# Patient Record
Sex: Female | Born: 1990
Health system: Southern US, Community
[De-identification: ages and names within clinical notes are randomized; demographics above are authoritative.]

## PROBLEM LIST (undated history)

## (undated) DIAGNOSIS — O09299 Supervision of pregnancy with other poor reproductive or obstetric history, unspecified trimester: Secondary | ICD-10-CM

## (undated) DIAGNOSIS — N83209 Unspecified ovarian cyst, unspecified side: Secondary | ICD-10-CM

## (undated) HISTORY — DX: Supervision of pregnancy with other poor reproductive or obstetric history, unspecified trimester: O09.299

---

## 2013-01-25 ENCOUNTER — Encounter (HOSPITAL_COMMUNITY): Payer: Self-pay | Admitting: *Deleted

## 2013-01-25 ENCOUNTER — Emergency Department (HOSPITAL_COMMUNITY): Payer: Self-pay

## 2013-01-25 ENCOUNTER — Emergency Department (HOSPITAL_COMMUNITY)
Admission: EM | Admit: 2013-01-25 | Discharge: 2013-01-25 | Disposition: A | Discharge status: Home / Self Care | Payer: Self-pay | Attending: Emergency Medicine | Admitting: Emergency Medicine

## 2013-01-25 DIAGNOSIS — N83209 Unspecified ovarian cyst, unspecified side: Secondary | ICD-10-CM | POA: Insufficient documentation

## 2013-01-25 DIAGNOSIS — R112 Nausea with vomiting, unspecified: Secondary | ICD-10-CM | POA: Insufficient documentation

## 2013-01-25 DIAGNOSIS — R630 Anorexia: Secondary | ICD-10-CM | POA: Insufficient documentation

## 2013-01-25 DIAGNOSIS — F17200 Nicotine dependence, unspecified, uncomplicated: Secondary | ICD-10-CM | POA: Insufficient documentation

## 2013-01-25 DIAGNOSIS — Z3202 Encounter for pregnancy test, result negative: Secondary | ICD-10-CM | POA: Insufficient documentation

## 2013-01-25 LAB — URINALYSIS, ROUTINE W REFLEX MICROSCOPIC
Bilirubin Urine: NEGATIVE
Glucose, UA: NEGATIVE mg/dL
Hgb urine dipstick: NEGATIVE
Ketones, ur: NEGATIVE mg/dL
Leukocytes, UA: NEGATIVE
Nitrite: NEGATIVE
Protein, ur: NEGATIVE mg/dL
Specific Gravity, Urine: 1.01 (ref 1.005–1.030)
Urobilinogen, UA: 0.2 mg/dL (ref 0.0–1.0)
pH: 7 (ref 5.0–8.0)

## 2013-01-25 LAB — COMPREHENSIVE METABOLIC PANEL
ALT: 11 U/L (ref 0–35)
AST: 16 U/L (ref 0–37)
Albumin: 3.4 g/dL — ABNORMAL LOW (ref 3.5–5.2)
Alkaline Phosphatase: 62 U/L (ref 39–117)
BUN: 6 mg/dL (ref 6–23)
CO2: 21 mEq/L (ref 19–32)
Calcium: 9.1 mg/dL (ref 8.4–10.5)
Chloride: 106 mEq/L (ref 96–112)
Creatinine, Ser: 0.81 mg/dL (ref 0.50–1.10)
GFR calc Af Amer: >90 mL/min (ref >90)
GFR calc non Af Amer: >90 mL/min (ref >90)
Glucose, Bld: 88 mg/dL (ref 70–99)
Potassium: 3.6 mEq/L (ref 3.5–5.1)
Sodium: 136 mEq/L (ref 135–145)
Total Bilirubin: 0.2 mg/dL — ABNORMAL LOW (ref 0.3–1.2)
Total Protein: 6.3 g/dL (ref 6.0–8.3)

## 2013-01-25 LAB — CBC WITH DIFFERENTIAL/PLATELET
Basophils Absolute: 0 10*3/uL (ref 0.0–0.1)
Basophils Relative: 1 % (ref 0–1)
Eosinophils Absolute: 0.1 10*3/uL (ref 0.0–0.7)
Eosinophils Relative: 3 % (ref 0–5)
HCT: 30 % — ABNORMAL LOW (ref 36.0–46.0)
Hemoglobin: 8.9 g/dL — ABNORMAL LOW (ref 12.0–15.0)
Lymphocytes Relative: 30 % (ref 12–46)
Lymphs Abs: 1.3 10*3/uL (ref 0.7–4.0)
MCH: 20.8 pg — ABNORMAL LOW (ref 26.0–34.0)
MCHC: 29.7 g/dL — ABNORMAL LOW (ref 30.0–36.0)
MCV: 70.1 fL — ABNORMAL LOW (ref 78.0–100.0)
Monocytes Absolute: 0.4 10*3/uL (ref 0.1–1.0)
Monocytes Relative: 8 % (ref 3–12)
Neutro Abs: 2.5 10*3/uL (ref 1.7–7.7)
Neutrophils Relative %: 58 % (ref 43–77)
Platelets: 377 10*3/uL (ref 150–400)
RBC: 4.28 MIL/uL (ref 3.87–5.11)
RDW: 19.5 % — ABNORMAL HIGH (ref 11.5–15.5)
WBC: 4.3 10*3/uL (ref 4.0–10.5)

## 2013-01-25 MED ORDER — IBUPROFEN 800 MG PO TABS: 800.0000 mg | ORAL_TABLET | Freq: Three times a day (TID) | ORAL | Status: DC | PRN

## 2013-01-25 MED ORDER — HYDROCODONE-ACETAMINOPHEN 5-325 MG PO TABS: 1.0000 | ORAL_TABLET | Freq: Four times a day (QID) | ORAL | Status: DC | PRN

## 2013-01-25 MED ORDER — SODIUM CHLORIDE 0.9 % IV BOLUS (SEPSIS)
1000.0000 mL | Freq: Once | INTRAVENOUS | Status: AC
  Administered 2013-01-25: 1000 mL via INTRAVENOUS

## 2013-01-25 NOTE — ED Notes (Signed)
Patient states lower abdominal pain starting this am at 0300, patient also states vomiting yesterday but none today, patient states pain 8/10. Patient also states there is a possibility of her being pregnant.  LMP, 12/17/12

## 2013-01-25 NOTE — ED Notes (Signed)
Patient transported to Ultrasound 

## 2013-01-25 NOTE — ED Notes (Signed)
Peripheral IV removed; site soft, clean, dry, intact, catheter intact.

## 2013-01-25 NOTE — Progress Notes (Signed)
Addendum 01/25/13 1404 W. Aundria Rud RN BSN [facility] ED CM spoke with patient. Pt states she does not have a PCP/ Medical insurance. Discussed The importance of PCP and continuity of care. Pt verbalized understanding and agrees. Provided pt with information [facility] to call for follow up appt. Also provided a [employer]. No further CM needs identified.

## 2013-01-25 NOTE — ED Provider Notes (Signed)
CSN: [id number]     Arrival date & time 01/25/13  1031 History     First MD Initiated Contact with Patient 01/25/13 1055     Chief Complaint  Patient presents with  . Abdominal Pain   (Consider location/radiation/quality/duration/timing/severity/associated sxs/prior Treatment) HPI Comments: Pt presents to the ED with sudden onset lower abdominal pain which woke her from sleep around 3:00 AM. The pain is sharp and constant, severity 8/10, no radiation. Pt states she has never had pain like this before. Also reports nausea, vomited last night before bed. She has not had anything to eat or drink today. She has not taken any meds at home to help with symptoms. Denies fever, diarrhea, constipation, dysuria, or hematuria. LMP 12/17/12, pt denies vaginal discharge or bleeding.   Patient is a 22 y.o. female presenting with abdominal pain.  Abdominal Pain Associated symptoms: nausea and vomiting   Associated symptoms: no chills, no constipation, no cough, no diarrhea, no dysuria, no fever, no hematuria, no shortness of breath, no vaginal bleeding and no vaginal discharge     History reviewed. No pertinent past medical history. History reviewed. No pertinent past surgical history. No family history on file. History  Substance Use Topics  . Smoking status: Current Every Day Smoker    Types: Cigarettes  . Smokeless tobacco: Not on file  . Alcohol Use: No   OB History   Grav Para Term Preterm Abortions TAB SAB Ect Mult Living                 Review of Systems  Constitutional: Positive for appetite change. Negative for fever and chills.  Respiratory: Negative for cough and shortness of breath.   Gastrointestinal: Positive for nausea, vomiting and abdominal pain. Negative for diarrhea and constipation.  Genitourinary: Negative for dysuria, hematuria, flank pain, vaginal bleeding, vaginal discharge, difficulty urinating, vaginal pain and pelvic pain.    Allergies  Review of patient's  allergies indicates no known allergies.  Home Medications  No current outpatient prescriptions on file. BP 132/90  Pulse 96  Temp(Src) 98.7 F (37.1 C) (Oral)  Resp 18  Ht 5\' 9"  (1.753 m)  Wt 135 lb (61.236 kg)  BMI 19.93 kg/m2  SpO2 100%  LMP 12/17/2012 Physical Exam  Nursing note and vitals reviewed. Constitutional: She is oriented to person, place, and time. She appears well-developed and well-nourished. No distress.  HENT:  Head: Normocephalic and atraumatic.  Mouth/Throat: Oropharynx is clear and moist.  Eyes: Pupils are equal, round, and reactive to light.  Neck: Normal range of motion. Neck supple.  Cardiovascular: Normal rate, regular rhythm and normal heart sounds.  Exam reveals no gallop and no friction rub.   No murmur heard. Pulmonary/Chest: Effort normal and breath sounds normal. No respiratory distress.  Abdominal: Soft. Bowel sounds are normal. She exhibits no distension. There is tenderness in the right lower quadrant, periumbilical area, suprapubic area and left lower quadrant. There is no rebound and no guarding.  Genitourinary: There is no tenderness on the right labia. There is no tenderness on the left labia. Cervix exhibits no motion tenderness and no discharge. Right adnexum displays tenderness. Left adnexum displays tenderness. There is tenderness around the vagina. No vaginal discharge found.  Neurological: She is alert and oriented to person, place, and time.  Skin: Skin is warm and dry. No rash noted. No erythema.    ED Course   Procedures (including critical care time)  Patient, he referred to the [facility] hospital GYN clinic for followup  and reevaluation.  She is told to return here as needed.  The patient has been stable here in the emergency department.  Patient's pain is under better control at this time  MDM    Carlyle Dolly, PA-C 01/27/13 0050

## 2013-01-25 NOTE — ED Notes (Signed)
Patient returned from Ultrasound. 

## 2013-01-27 NOTE — ED Provider Notes (Signed)
Medical screening examination/treatment/procedure(s) were performed by non-physician practitioner and as supervising physician I was immediately available for consultation/collaboration.  Mahdi Frye L Jayd Cadieux, MD 01/27/13 1424 

## 2013-02-11 ENCOUNTER — Encounter: Payer: Self-pay | Admitting: Obstetrics & Gynecology

## 2013-02-11 ENCOUNTER — Ambulatory Visit (INDEPENDENT_AMBULATORY_CARE_PROVIDER_SITE_OTHER): Payer: Self-pay | Admitting: Obstetrics & Gynecology

## 2013-02-11 VITALS — BP 103/68 | HR 89 | Temp 98.7°F | Ht 69.0 in | Wt 131.7 lb

## 2013-02-11 DIAGNOSIS — N949 Unspecified condition associated with female genital organs and menstrual cycle: Secondary | ICD-10-CM

## 2013-02-11 DIAGNOSIS — G8929 Other chronic pain: Secondary | ICD-10-CM | POA: Insufficient documentation

## 2013-02-11 DIAGNOSIS — N83209 Unspecified ovarian cyst, unspecified side: Secondary | ICD-10-CM

## 2013-02-11 MED ORDER — IBUPROFEN 800 MG PO TABS: 800.0000 mg | ORAL_TABLET | Freq: Three times a day (TID) | ORAL | Status: DC | PRN

## 2013-02-11 NOTE — Patient Instructions (Signed)
Ovarian Cyst The ovaries are small organs that are on each side of the uterus. The ovaries are the organs that produce the female hormones, estrogen and progesterone. An ovarian cyst is a sac filled with fluid that can vary in its size. It is normal for a small cyst to form in women who are in the childbearing age and who have menstrual periods. This type of cyst is called a follicle cyst that becomes an ovulation cyst (corpus luteum cyst) after it produces the women's egg. It later goes away on its own if the woman does not become pregnant. There are other kinds of ovarian cysts that may cause problems and may need to be treated. The most serious problem is a cyst with cancer. It should be noted that menopausal women who have an ovarian cyst are at a higher risk of it being a cancer cyst. They should be evaluated very quickly, thoroughly and followed closely. This is especially true in menopausal women because of the high rate of ovarian cancer in women in menopause. CAUSES AND TYPES OF OVARIAN CYSTS:  FUNCTIONAL CYST: The follicle/corpus luteum cyst is a functional cyst that occurs every month during ovulation with the menstrual cycle. They go away with the next menstrual cycle if the woman does not get pregnant. Usually, there are no symptoms with a functional cyst.  ENDOMETRIOMA CYST: This cyst develops from the lining of the uterus tissue. This cyst gets in or on the ovary. It grows every month from the bleeding during the menstrual period. It is also called a "chocolate cyst" because it becomes filled with blood that turns brown. This cyst can cause pain in the lower abdomen during intercourse and with your menstrual period.  CYSTADENOMA CYST: This cyst develops from the cells on the outside of the ovary. They usually are not cancerous. They can get very big and cause lower abdomen pain and pain with intercourse. This type of cyst can twist on itself, cut off its blood supply and cause severe pain. It  also can easily rupture and cause a lot of pain.  DERMOID CYST: This type of cyst is sometimes found in both ovaries. They are found to have different kinds of body tissue in the cyst. The tissue includes skin, teeth, hair, and/or cartilage. They usually do not have symptoms unless they get very big. Dermoid cysts are rarely cancerous.  POLYCYSTIC OVARY: This is a rare condition with hormone problems that produces many small cysts on both ovaries. The cysts are follicle-like cysts that never produce an egg and become a corpus luteum. It can cause an increase in body weight, infertility, acne, increase in body and facial hair and lack of menstrual periods or rare menstrual periods. Many women with this problem develop type 2 diabetes. The exact cause of this problem is unknown. A polycystic ovary is rarely cancerous.  THECA LUTEIN CYST: Occurs when too much hormone (human chorionic gonadotropin) is produced and over-stimulates the ovaries to produce an egg. They are frequently seen when doctors stimulate the ovaries for invitro-fertilization (test tube babies).  LUTEOMA CYST: This cyst is seen during pregnancy. Rarely it can cause an obstruction to the birth canal during labor and delivery. They usually go away after delivery. SYMPTOMS   Pelvic pain or pressure.  Pain during sexual intercourse.  Increasing girth (swelling) of the abdomen.  Abnormal menstrual periods.  Increasing pain with menstrual periods.  You stop having menstrual periods and you are not pregnant. DIAGNOSIS  The diagnosis can   be made during:  Routine or annual pelvic examination (common).  Ultrasound.  X-ray of the pelvis.  CT Scan.  MRI.  Blood tests. TREATMENT   Treatment may only be to follow the cyst monthly for 2 to 3 months with your caregiver. Many go away on their own, especially functional cysts.  May be aspirated (drained) with a long needle with ultrasound, or by laparoscopy (inserting a tube into  the pelvis through a small incision).  The whole cyst can be removed by laparoscopy.  Sometimes the cyst may need to be removed through an incision in the lower abdomen.  Hormone treatment is sometimes used to help dissolve certain cysts.  Birth control pills are sometimes used to help dissolve certain cysts. HOME CARE INSTRUCTIONS  Follow your caregiver's advice regarding:  Medicine.  Follow up visits to evaluate and treat the cyst.  You may need to come back or make an appointment with another caregiver, to find the exact cause of your cyst, if your caregiver is not a gynecologist.  Get your yearly and recommended pelvic examinations and Pap tests.  Let your caregiver know if you have had an ovarian cyst in the past. SEEK MEDICAL CARE IF:   Your periods are late, irregular, they stop, or are painful.  Your stomach (abdomen) or pelvic pain does not go away.  Your stomach becomes larger or swollen.  You have pressure on your bladder or trouble emptying your bladder completely.  You have painful sexual intercourse.  You have feelings of fullness, pressure, or discomfort in your stomach.  You lose weight for no apparent reason.  You feel generally ill.  You become constipated.  You lose your appetite.  You develop acne.  You have an increase in body and facial hair.  You are gaining weight, without changing your exercise and eating habits.  You think you are pregnant. SEEK IMMEDIATE MEDICAL CARE IF:   You have increasing abdominal pain.  You feel sick to your stomach (nausea) and/or vomit.  You develop a fever that comes on suddenly.  You develop abdominal pain during a bowel movement.  Your menstrual periods become heavier than usual. Document Released: 06/04/2005 Document Revised: 08/27/2011 Document Reviewed: 04/07/2009 [facility] Patient Information 2014 [place], [place].  Endometriosis Endometriosis is a disease that occurs when the endometrium (lining  of the uterus) is misplaced outside of its normal location. It may occur in many locations close to the uterus (womb), but commonly on the ovaries, fallopian tubes, vagina (birth canal) and bowel located close to the uterus. Because the uterus sloughs (expels) its lining every month (menses), there is bleeding whereever the endometrial tissue is located. SYMPTOMS  Often there are no symptoms. However, because blood is irritating to tissues not normally exposed to it, when symptoms occur they vary with the location of the misplaced endometrium. Symptoms often include back and abdominal pain. Periods may be heavier and intercourse may be painful. Infertility may be present. You may have all of these symptoms at one time or another or you may have months with no symptoms at all. Although the symptoms occur mainly during menses, they can occur mid-cycle as well, and usually terminate with menopause. DIAGNOSIS  Your caregiver may recommend a blood test and urine test (urinalysis) to help rule out other conditions. Another common test is ultrasound, a painless procedure that uses sound waves to make a sonogram "picture" of abnormal tissue that could be endometriosis. If your bowel movements are painful around your periods, your caregiver  may advise a barium enema (an X-ray of the lower bowel), to try to find the source of your pain. This is sometimes confirmed by laparoscopy. Laparoscopy is a procedure where your caregiver looks into your abdomen with a laparoscope (a small pencil sized telescope). Your caregiver may take a tiny piece of tissue (biopsy) from any abnormal tissue to confirm or document your problem. These tissues are sent to the lab and a pathologist looks at them under the microscope to give a microscopic diagnosis. TREATMENT  Once the diagnosis is made, it can be treated by destruction of the misplaced endometrial tissue using heat (diathermy), laser, cutting (excision), or chemical means. It may  also be treated with hormonal therapy. When using hormonal therapy menses are eliminated, therefore eliminating the monthly exposure to blood by the misplaced endometrial tissue. Only in severe cases is it necessary to perform a hysterectomy with removal of the tubes, uterus and ovaries. HOME CARE INSTRUCTIONS   Only take over-the-counter or prescription medicines for pain, discomfort, or fever as directed by your caregiver.  Avoid activities that produce pain, including a physical sexual relationship.  Do not take aspirin as this may increase bleeding when not on hormonal therapy.  See your caregiver for pain or problems not controlled with treatment. SEEK IMMEDIATE MEDICAL CARE IF:   Your pain is severe and is not responding to pain medicine.  You develop severe nausea and vomiting, or you cannot keep foods down.  Your pain localizes to the right lower part of your abdomen (possible appendicitis).  You have swelling or increasing pain in the abdomen.  You have a fever.  You see blood in your stool. MAKE SURE YOU:   Understand these instructions.  Will watch your condition.  Will get help right away if you are not doing well or get worse. Document Released: 06/01/2000 Document Revised: 08/27/2011 Document Reviewed: 01/21/2008 [facility] Patient Information 2014 [place], [place].  Diagnostic Laparoscopy Laparoscopy is a surgical procedure. It is used to diagnose and treat diseases inside the belly(abdomen). It is usually a brief, common, and relatively simple procedure. The laparoscopeis a thin, lighted, pencil-sized instrument. It is like a telescope. It is inserted into your abdomen through a small cut (incision). Your caregiver can look at the organs inside your body through this instrument. He or she can see if there is anything abnormal. Laparoscopy can be done either in a hospital or outpatient clinic. You may be given a mild sedative to help you relax before the procedure. Once  in the operating room, you will be given a drug to make you sleep (general anesthesia). Laparoscopy usually lasts less than 1 hour. After the procedure, you will be monitored in a recovery area until you are stable and doing well. Once you are home, it will take 2 to 3 days to fully recover. RISKS AND COMPLICATIONS  Laparoscopy has relatively few risks. Your caregiver will discuss the risks with you before the procedure. Some problems that can occur include:  Infection.  Bleeding.  Damage to other organs.  Anesthetic side effects. PROCEDURE Once you receive anesthesia, your surgeon inflates the abdomen with a harmless gas (carbon dioxide). This makes the organs easier to see. The laparoscope is inserted into the abdomen through a small incision. This allows your surgeon to see into the abdomen. Other small instruments are also inserted into the abdomen through other small openings. Many surgeons attach a video camera to the laparoscope to enlarge the view. During a diagnostic laparoscopy, the surgeon  may be looking for inflammation, infection, or cancer. Your surgeon may take tissue samples(biopsies). The samples are sent to a specialist in looking at cells and tissue samples (pathologist). The pathologist examines them under a microscope. Biopsies can help to diagnose or confirm a disease. AFTER THE PROCEDURE   The gas is released from inside the abdomen.  The incisions are closed with stitches (sutures). Because these incisions are small (usually less than 1/2 inch), there is usually minimal discomfort after the procedure. There may be some mild discomfort in the throat. This is from the tube placed in the throat while you were sleeping. You may have some mild abdominal discomfort. There may also be discomfort from the instrument placement incisions in the abdomen.  The recovery time is shortened as long as there are no complications.  You will rest in a recovery room until stable and doing  well. As long as there are no complications, you may be allowed to go home. FINDING OUT THE RESULTS OF YOUR TEST Not all test results are available during your visit. If your test results are not back during the visit, make an appointment with your caregiver to find out the results. Do not assume everything is normal if you have not heard from your caregiver or the medical facility. It is important for you to follow up on all of your test results. HOME CARE INSTRUCTIONS   Take all medicines as directed.  Only take over-the-counter or prescription medicines for pain, discomfort, or fever as directed by your caregiver.  Resume daily activities as directed.  Showers are preferred over baths.  You may resume sexual activities in 1 week or as directed.  Do not drive while taking narcotics. SEEK MEDICAL CARE IF:   There is increasing abdominal pain.  There is new pain in the shoulders (shoulder strap areas).  You feel lightheaded or faint.  You have the chills.  You or your child has an oral temperature above 102 F (38.9 C).  There is pus-like (purulent) drainage from any of the wounds.  You are unable to pass gas or have a bowel movement.  You feel sick to your stomach (nauseous) or throw up (vomit). MAKE SURE YOU:   Understand these instructions.  Will watch your condition.  Will get help right away if you are not doing well or get worse. Document Released: 09/10/2000 Document Revised: 08/27/2011 Document Reviewed: 06/04/2007 [facility] Patient Information 2014 [place], [place].

## 2013-02-12 NOTE — Progress Notes (Signed)
GYNECOLOGY CLINIC PROGRESS NOTE  History:  22 y.o. [name] here today for follow up after evaluation of pelvic pain in the ED on 01/25/13 showed a 5.4 cm complex right ovarian cyst that was thought to be a hemorrhagic cyst.  She reports that she still has constant pain all around her lower abdomen.  She says the pain has been going on for several years, since her cesarean section in 2009.  No relation to menstrual periods; no exacerbating factors but it ameliorated on pain medications.  No other associated symptoms; she reports normal menstrual periods. Normal discharge and she denies any history of STIs.  The following portions of the patient's history were reviewed and updated as appropriate: allergies, current medications, past family history, past medical history, past social history, past surgical history and problem list.  Review of Systems:  A comprehensive review of systems was negative.  Objective:  Physical Exam BP 103/68  Pulse 89  Temp(Src) 98.7 F (37.1 C) (Oral)  Ht 5\' 9"  (1.753 m)  Wt 131 lb 11.2 oz (59.739 kg)  BMI 19.44 kg/m2  LMP 01/17/2013 Gen: NAD Abd: Soft, mild diffuse tenderness on palpation; no focal points, no rebound/guarding, nondistended. Patient has excoriated eczematoid reaction below her umbilicus and on top of her well-healed cesarean scar; she reports she got this after wearing a belt buckle and she has been scratching it since then. Pelvic: Normal appearing external genitalia; normal appearing vaginal mucosa and cervix.  Normal discharge seen.  Small uterus, no other palpable masses, diffuse uterine and adnexal tenderness during bimanual exam, no CMT.   Labs and Imaging 01/25/2013  TRANSABDOMINAL ULTRASOUND OF PELVIS DOPPLER ULTRASOUND OF OVARIES Clinical Data:  Pelvic pain  Technique:  Transabdominal ultrasound examination of the pelvis was performed including evaluation of the uterus, ovaries, adnexal regions, and pelvic cul-de-sac.  Color and duplex Doppler  ultrasound was utilized to evaluate blood flow to the ovaries.  Comparison:  None.  Findings:  Uterus:  9.6 x 6.0 x 7.4 centimeters.  No myometrial masses.  Endometrium:  Endometrium is 7 mm in overall thickness.  Complex fluid is present within the endometrial canal.  No endometrial polyps or masses.  Right ovary:  The overall size of the right ovary is 6.5 x 4.8 x 6.0 cm.  There is a complex cystic lesion within the ovary measuring 5.4 x 4.2 x 5.0 cm.  It is characterized by a reticular pattern of thin septations and anechoic fluid.  Arterial and venous flow was documented on Doppler analysis within the thin rim of normal ovarian tissue surrounding the lesion.  Left ovary:  Within normal limits.  Arterial and venous Doppler flow is documented within the left ovary.  Pulsed Doppler evaluation demonstrates normal low-resistance arterial and venous waveforms in both ovaries.  IMPRESSION: No evidence of ovarian torsion.  5.4 cm complex cystic lesion within the right ovary.  This is likely a hemorrhagic cyst.  Follow-up ultrasound in 5 weeks is recommended to ensure resolution. This recommendation follows the consensus statement:  Management of Asymptomatic Ovarian and Other Adnexal Cysts Imaged at [place]:  Society of Radiologists in Ultrasound Consensus Conference Statement.  Radiology 2010; [phone].   Original Report Authenticated By: Jolaine Click, M.D.    Assessment & Plan:  Patient has chronic pelvic pain; not likely due to her ovarian cyst. The concern is about possible adhesive disease, endometriosis or other anomaly.  Recommended diagnostic laparoscopy for evaluation, patient agrees with this recommendation. Surgical risks discussed.  All questions were answered.  She was told that she will be contacted by our surgical scheduler regarding the time and date of her surgery.  She was told she may be called for a preoperative appointment about a week prior to surgery and will be given further preoperative  instructions at that visit. Printed patient education handouts about the procedure were given to the patient to review at home.  In the meantime, she was prescribed some Ibuprofen 800 mg po tid, pain precautions reviewed.  She was advised to use OTC hydrocortisone cream for her contact dermatitis and to see a dermatologist if it gets worse.

## 2013-03-26 ENCOUNTER — Ambulatory Visit: Payer: Self-pay | Admitting: Obstetrics & Gynecology

## 2013-04-07 ENCOUNTER — Ambulatory Visit (HOSPITAL_COMMUNITY): Admission: RE | Admit: 2013-04-07 | Payer: Self-pay | Source: Ambulatory Visit | Admitting: Obstetrics & Gynecology

## 2013-04-07 ENCOUNTER — Encounter (HOSPITAL_COMMUNITY): Admission: RE | Payer: Self-pay | Source: Ambulatory Visit

## 2013-04-07 SURGERY — LAPAROSCOPY, DIAGNOSTIC
Anesthesia: Choice | Site: Abdomen

## 2013-04-07 NOTE — H&P (Signed)
Patient did not show up for scheduled surgery today.  Tereso Newcomer, MD 04/07/2013 12:09 PM

## 2013-05-07 ENCOUNTER — Ambulatory Visit: Payer: Self-pay | Admitting: Obstetrics & Gynecology

## 2013-05-31 ENCOUNTER — Emergency Department (HOSPITAL_COMMUNITY): Payer: Self-pay

## 2013-05-31 ENCOUNTER — Encounter (HOSPITAL_COMMUNITY): Payer: Self-pay | Admitting: Emergency Medicine

## 2013-05-31 ENCOUNTER — Emergency Department (HOSPITAL_COMMUNITY)
Admission: EM | Admit: 2013-05-31 | Discharge: 2013-05-31 | Disposition: A | Discharge status: Home / Self Care | Payer: Self-pay | Attending: Emergency Medicine | Admitting: Emergency Medicine

## 2013-05-31 DIAGNOSIS — F17200 Nicotine dependence, unspecified, uncomplicated: Secondary | ICD-10-CM | POA: Insufficient documentation

## 2013-05-31 DIAGNOSIS — J069 Acute upper respiratory infection, unspecified: Secondary | ICD-10-CM | POA: Insufficient documentation

## 2013-05-31 MED ORDER — BENZONATATE 100 MG PO CAPS: 100.0000 mg | ORAL_CAPSULE | Freq: Three times a day (TID) | ORAL | Status: DC

## 2013-05-31 MED ORDER — BENZONATATE 100 MG PO CAPS
100.0000 mg | ORAL_CAPSULE | Freq: Once | ORAL | Status: AC
Start: 2013-05-31 — End: 2013-05-31
  Administered 2013-05-31: 100 mg via ORAL
  Filled 2013-05-31: qty 1

## 2013-05-31 NOTE — ED Notes (Signed)
Increased chest discomfort with cough and deep breathing.  Non productive cough

## 2013-05-31 NOTE — ED Notes (Signed)
The pt has had mid-chest pain since this am with sob.  No sob now.  No hx of any medical problems.  lmp last month

## 2013-05-31 NOTE — ED Provider Notes (Signed)
CSN: [id number]     Arrival date & time 05/31/13  1830 History   First MD Initiated Contact with Patient 05/31/13 2032     Chief Complaint  Patient presents with  . Chest Pain   (Consider location/radiation/quality/duration/timing/severity/associated sxs/prior Treatment) HPI Shanin Szymanowski is a 22 y.o. female who presents to the emergency department for cough, congestion, and runny nose.  Started yesterday.  Now coughed to the point that it hurts when she coughs.  Pain moderate in severity.  Worse with coughing.  Better with nothing.  Located in central chest.  No radiation.  No other symptoms.  History reviewed. No pertinent past medical history. Past Surgical History  Procedure Laterality Date  . Cesarean section  07/07/2007   Family History: Reviewed.  None pertinent.    History  Substance Use Topics  . Smoking status: Current Every Day Smoker    Types: Cigarettes  . Smokeless tobacco: Not on file  . Alcohol Use: No   OB History   Grav Para Term Preterm Abortions TAB SAB Ect Mult Living   1 1 1       1      Review of Systems  Constitutional: Negative for fever and chills.  HENT: Positive for congestion and rhinorrhea.   Respiratory: Positive for cough. Negative for shortness of breath.   Cardiovascular: Positive for chest pain.  Gastrointestinal: Negative for nausea, vomiting, abdominal pain, diarrhea and abdominal distention.  Endocrine: Negative for polyuria.  Genitourinary: Negative for dysuria.  Musculoskeletal: Negative for neck pain and neck stiffness.  Skin: Negative for rash.  Neurological: Negative for headaches.  Psychiatric/Behavioral: Negative.     Allergies  Review of patient's allergies indicates no known allergies.  Home Medications  No current outpatient prescriptions on file. BP 122/79  Pulse 68  Temp(Src) 99.2 F (37.3 C) (Oral)  Resp 20  Wt 140 lb 6.4 oz (63.685 kg)  SpO2 100%  LMP 05/01/2013 Physical Exam  Nursing note and vitals  reviewed. Constitutional: She is oriented to person, place, and time. She appears well-developed and well-nourished. No distress.  HENT:  Head: Normocephalic and atraumatic.  Right Ear: External ear normal.  Left Ear: External ear normal.  Nose: Nose normal.  Mouth/Throat: Oropharynx is clear and moist. No oropharyngeal exudate.  Eyes: EOM are normal. Pupils are equal, round, and reactive to light.  Neck: Normal range of motion. Neck supple. No tracheal deviation present.  Cardiovascular: Normal rate.   Pulmonary/Chest: Effort normal and breath sounds normal. No stridor. No respiratory distress. She has no wheezes. She has no rales. She exhibits tenderness.  Abdominal: Soft. She exhibits no distension. There is no tenderness. There is no rebound.  Musculoskeletal: Normal range of motion.  Neurological: She is alert and oriented to person, place, and time.  Skin: Skin is warm and dry. She is not diaphoretic.    ED Course  Procedures (including critical care time) Labs Review Labs Reviewed - No data to display Imaging Review Dg Chest 2 View  05/31/2013   CLINICAL DATA:  Chest pain.  Cough.  EXAM: CHEST  2 VIEW  COMPARISON:  None.  FINDINGS: The heart size and mediastinal contours are within normal limits. Both lungs are clear. The visualized skeletal structures are unremarkable.  IMPRESSION: No active cardiopulmonary disease.   Electronically Signed   By: Andreas Newport M.D.   On: 05/31/2013 20:29    EKG Interpretation   None       MDM   1. Upper respiratory infection  Lonette Stevison is a 22 y.o. female who presents to the ED with URI symptoms and pleuritic chest pain with cough.  History and exam very c/w viral URI.  Patient is WELLS/PERC negative.  No indication for CT or further testing at this point.  Doubt MI.  Atypical presentation for dissection in healthy young person.  Patient safe for discharge home.  Patient discharged.    Arloa Koh, MD 06/01/13 [phone]

## 2013-06-01 NOTE — ED Provider Notes (Signed)
I saw and evaluated the patient, reviewed the resident's note and I agree with the findings and plan.  EKG Interpretation   None        Patient here with URI symptoms. Exam benign. Vitals stable. Instructed to f/u with PCP and continue supportive care.  Dagmar Hait, MD 06/01/13 [phone]

## 2014-01-23 ENCOUNTER — Emergency Department (HOSPITAL_COMMUNITY)
Admission: EM | Admit: 2014-01-23 | Discharge: 2014-01-25 | Discharge status: Against Medical Advice | Payer: Self-pay | Attending: Emergency Medicine | Admitting: Emergency Medicine

## 2014-01-23 ENCOUNTER — Encounter (HOSPITAL_COMMUNITY): Payer: Self-pay | Admitting: Emergency Medicine

## 2014-01-23 DIAGNOSIS — Z3202 Encounter for pregnancy test, result negative: Secondary | ICD-10-CM | POA: Insufficient documentation

## 2014-01-23 DIAGNOSIS — Z8742 Personal history of other diseases of the female genital tract: Secondary | ICD-10-CM | POA: Insufficient documentation

## 2014-01-23 DIAGNOSIS — Z9889 Other specified postprocedural states: Secondary | ICD-10-CM | POA: Insufficient documentation

## 2014-01-23 DIAGNOSIS — F17200 Nicotine dependence, unspecified, uncomplicated: Secondary | ICD-10-CM | POA: Insufficient documentation

## 2014-01-23 DIAGNOSIS — R109 Unspecified abdominal pain: Secondary | ICD-10-CM | POA: Insufficient documentation

## 2014-01-23 HISTORY — DX: Unspecified ovarian cyst, unspecified side: N83.209

## 2014-01-23 LAB — CBC WITH DIFFERENTIAL/PLATELET
Basophils Absolute: 0 10*3/uL (ref 0.0–0.1)
Basophils Relative: 1 % (ref 0–1)
EOS ABS: 0.1 10*3/uL (ref 0.0–0.7)
Eosinophils Relative: 2 % (ref 0–5)
HEMATOCRIT: 30.4 % — AB (ref 36.0–46.0)
HEMOGLOBIN: 8.9 g/dL — AB (ref 12.0–15.0)
LYMPHS ABS: 2.4 10*3/uL (ref 0.7–4.0)
Lymphocytes Relative: 49 % — ABNORMAL HIGH (ref 12–46)
MCH: 20.8 pg — ABNORMAL LOW (ref 26.0–34.0)
MCHC: 29.3 g/dL — AB (ref 30.0–36.0)
MCV: 71.2 fL — AB (ref 78.0–100.0)
MONO ABS: 0.4 10*3/uL (ref 0.1–1.0)
Monocytes Relative: 9 % (ref 3–12)
NEUTROS ABS: 1.8 10*3/uL (ref 1.7–7.7)
Neutrophils Relative %: 39 % — ABNORMAL LOW (ref 43–77)
Platelets: 423 10*3/uL — ABNORMAL HIGH (ref 150–400)
RBC: 4.27 MIL/uL (ref 3.87–5.11)
RDW: 19.4 % — ABNORMAL HIGH (ref 11.5–15.5)
WBC: 4.7 10*3/uL (ref 4.0–10.5)

## 2014-01-23 LAB — COMPREHENSIVE METABOLIC PANEL
ALT: 9 U/L (ref 0–35)
ANION GAP: 15 (ref 5–15)
AST: 17 U/L (ref 0–37)
Albumin: 3.9 g/dL (ref 3.5–5.2)
Alkaline Phosphatase: 68 U/L (ref 39–117)
BUN: 15 mg/dL (ref 6–23)
CO2: 21 mEq/L (ref 19–32)
Calcium: 9.2 mg/dL (ref 8.4–10.5)
Chloride: 104 mEq/L (ref 96–112)
Creatinine, Ser: 0.9 mg/dL (ref 0.50–1.10)
GFR calc non Af Amer: 89 mL/min — ABNORMAL LOW (ref >90)
GLUCOSE: 98 mg/dL (ref 70–99)
Potassium: 4.1 mEq/L (ref 3.7–5.3)
Sodium: 140 mEq/L (ref 137–147)
TOTAL PROTEIN: 7 g/dL (ref 6.0–8.3)
Total Bilirubin: <0.2 mg/dL — ABNORMAL LOW (ref 0.3–1.2)

## 2014-01-23 LAB — URINE MICROSCOPIC-ADD ON

## 2014-01-23 LAB — URINALYSIS, ROUTINE W REFLEX MICROSCOPIC
BILIRUBIN URINE: NEGATIVE
Glucose, UA: NEGATIVE mg/dL
Hgb urine dipstick: NEGATIVE
Ketones, ur: NEGATIVE mg/dL
NITRITE: NEGATIVE
Protein, ur: NEGATIVE mg/dL
Specific Gravity, Urine: 1.021 (ref 1.005–1.030)
UROBILINOGEN UA: 1 mg/dL (ref 0.0–1.0)
pH: 7 (ref 5.0–8.0)

## 2014-01-23 LAB — PREGNANCY, URINE: Preg Test, Ur: NEGATIVE

## 2014-01-23 NOTE — ED Notes (Signed)
Pt. reports low abdominal pain onset this evening , denies injury, no nausea or vomitting , denies fever or chills , no diarrhea or vaginal discharge / dysuria .

## 2014-02-01 ENCOUNTER — Emergency Department (HOSPITAL_COMMUNITY)
Admission: EM | Admit: 2014-02-01 | Discharge: 2014-02-01 | Disposition: A | Discharge status: Home / Self Care | Payer: Self-pay | Attending: Emergency Medicine | Admitting: Emergency Medicine

## 2014-02-01 ENCOUNTER — Emergency Department (HOSPITAL_COMMUNITY): Payer: Self-pay

## 2014-02-01 ENCOUNTER — Encounter (HOSPITAL_COMMUNITY): Payer: Self-pay | Admitting: Emergency Medicine

## 2014-02-01 DIAGNOSIS — R109 Unspecified abdominal pain: Secondary | ICD-10-CM | POA: Insufficient documentation

## 2014-02-01 DIAGNOSIS — Z3202 Encounter for pregnancy test, result negative: Secondary | ICD-10-CM | POA: Insufficient documentation

## 2014-02-01 DIAGNOSIS — N949 Unspecified condition associated with female genital organs and menstrual cycle: Secondary | ICD-10-CM | POA: Insufficient documentation

## 2014-02-01 DIAGNOSIS — F17200 Nicotine dependence, unspecified, uncomplicated: Secondary | ICD-10-CM | POA: Insufficient documentation

## 2014-02-01 DIAGNOSIS — G8929 Other chronic pain: Secondary | ICD-10-CM | POA: Insufficient documentation

## 2014-02-01 DIAGNOSIS — R102 Pelvic and perineal pain: Secondary | ICD-10-CM

## 2014-02-01 DIAGNOSIS — A599 Trichomoniasis, unspecified: Secondary | ICD-10-CM | POA: Insufficient documentation

## 2014-02-01 LAB — COMPREHENSIVE METABOLIC PANEL
ALBUMIN: 3.9 g/dL (ref 3.5–5.2)
ALT: 11 U/L (ref 0–35)
AST: 21 U/L (ref 0–37)
Alkaline Phosphatase: 64 U/L (ref 39–117)
Anion gap: 13 (ref 5–15)
BUN: 9 mg/dL (ref 6–23)
CO2: 20 meq/L (ref 19–32)
CREATININE: 0.73 mg/dL (ref 0.50–1.10)
Calcium: 9.4 mg/dL (ref 8.4–10.5)
Chloride: 105 mEq/L (ref 96–112)
GFR calc Af Amer: >90 mL/min (ref >90)
Glucose, Bld: 93 mg/dL (ref 70–99)
Potassium: 4.2 mEq/L (ref 3.7–5.3)
Sodium: 138 mEq/L (ref 137–147)
Total Bilirubin: <0.2 mg/dL — ABNORMAL LOW (ref 0.3–1.2)
Total Protein: 7.3 g/dL (ref 6.0–8.3)

## 2014-02-01 LAB — CBC WITH DIFFERENTIAL/PLATELET
BASOS PCT: 1 % (ref 0–1)
Basophils Absolute: 0 10*3/uL (ref 0.0–0.1)
EOS PCT: 3 % (ref 0–5)
Eosinophils Absolute: 0.1 10*3/uL (ref 0.0–0.7)
HEMATOCRIT: 31.4 % — AB (ref 36.0–46.0)
HEMOGLOBIN: 9.1 g/dL — AB (ref 12.0–15.0)
LYMPHS PCT: 35 % (ref 12–46)
Lymphs Abs: 1.3 10*3/uL (ref 0.7–4.0)
MCH: 20.2 pg — ABNORMAL LOW (ref 26.0–34.0)
MCHC: 29 g/dL — ABNORMAL LOW (ref 30.0–36.0)
MCV: 69.8 fL — ABNORMAL LOW (ref 78.0–100.0)
Monocytes Absolute: 0.4 10*3/uL (ref 0.1–1.0)
Monocytes Relative: 10 % (ref 3–12)
NEUTROS ABS: 1.9 10*3/uL (ref 1.7–7.7)
Neutrophils Relative %: 51 % (ref 43–77)
Platelets: 390 10*3/uL (ref 150–400)
RBC: 4.5 MIL/uL (ref 3.87–5.11)
RDW: 19.1 % — ABNORMAL HIGH (ref 11.5–15.5)
WBC: 3.7 10*3/uL — AB (ref 4.0–10.5)

## 2014-02-01 LAB — URINALYSIS, ROUTINE W REFLEX MICROSCOPIC
Bilirubin Urine: NEGATIVE
Glucose, UA: NEGATIVE mg/dL
Hgb urine dipstick: NEGATIVE
Ketones, ur: NEGATIVE mg/dL
LEUKOCYTES UA: NEGATIVE
Nitrite: NEGATIVE
PROTEIN: NEGATIVE mg/dL
Specific Gravity, Urine: 1.025 (ref 1.005–1.030)
Urobilinogen, UA: 1 mg/dL (ref 0.0–1.0)
pH: 6 (ref 5.0–8.0)

## 2014-02-01 LAB — WET PREP, GENITAL
Clue Cells Wet Prep HPF POC: NONE SEEN
Yeast Wet Prep HPF POC: NONE SEEN

## 2014-02-01 MED ORDER — ONDANSETRON 4 MG PO TBDP: 4.0000 mg | ORAL_TABLET | Freq: Three times a day (TID) | ORAL | Status: DC | PRN

## 2014-02-01 MED ORDER — METRONIDAZOLE 500 MG PO TABS
2000.0000 mg | ORAL_TABLET | Freq: Once | ORAL | Status: AC
  Administered 2014-02-01: 2000 mg via ORAL
  Filled 2014-02-01: qty 4

## 2014-02-01 MED ORDER — ONDANSETRON HCL 4 MG/2ML IJ SOLN
4.0000 mg | Freq: Once | INTRAMUSCULAR | Status: AC
Start: 2014-02-01 — End: 2014-02-01
  Administered 2014-02-01: 4 mg via INTRAVENOUS
  Filled 2014-02-01: qty 2

## 2014-02-01 MED ORDER — HYDROMORPHONE HCL PF 1 MG/ML IJ SOLN
1.0000 mg | Freq: Once | INTRAMUSCULAR | Status: AC
  Administered 2014-02-01: 1 mg via INTRAVENOUS
  Filled 2014-02-01: qty 1

## 2014-02-01 MED ORDER — ONDANSETRON 4 MG PO TBDP
4.0000 mg | ORAL_TABLET | Freq: Once | ORAL | Status: AC
  Administered 2014-02-01: 4 mg via ORAL
  Filled 2014-02-01: qty 1

## 2014-02-01 MED ORDER — HYDROCODONE-ACETAMINOPHEN 5-325 MG PO TABS: ORAL_TABLET | ORAL | Status: DC
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
## 2014-02-01 NOTE — ED Notes (Signed)
PA notified that pelvic cart is ready for use.

## 2014-02-01 NOTE — Discharge Instructions (Signed)
Please read and follow all provided instructions.  Your diagnoses today include:  1. Trichomonas infection   2. Chronic pelvic pain in female     Tests performed today include:  Test for gonorrhea and chlamydia. You will be notified by telephone if you have a positive result.  Wet prep - shows trichomonas infection  Ultrasound - no large cysts or signs of infection  Vital signs. See below for your results today.   Medications:  You were treated for trichomonas infection with a one-time dose of metronidazole.    Vicodin (hydrocodone/acetaminophen) - narcotic pain medication  DO NOT drive or perform any activities that require you to be awake and alert because this medicine can make you drowsy. BE VERY CAREFUL not to take multiple medicines containing Tylenol (also called acetaminophen). Doing so can lead to an overdose which can damage your liver and cause liver failure and possibly death.   Zofran (ondansetron) - for nausea and vomiting   Home care instructions:  Read educational materials contained in this packet and follow any instructions provided.   You should tell your partners about your infection and avoid having sex for one week to allow time for the medicine to work.  Follow-up instructions: You should [facility] to be tested for HIV, syphilis, and hepatitis -- all of which can be transmitted by sexual contact. We do not routinely screen for these in the Emergency Department.  STD Testing:  [facility] [place] [place] [facility]  [address], [place], phone [phone] or [phone]    Monday - Friday, call for an appointment  [facility] [place] [place] [facility]  [address], [place], phone [phone] or [phone]   Monday - Friday, call for an appointment  Return instructions:   Please return to the Emergency Department if you experience  worsening symptoms.   Please return if you have any other emergent concerns.  Additional Information:  Your vital signs today were: BP 101/62   Pulse 64   Temp(Src) 99.5 F (37.5 C) (Oral)   Resp 16   SpO2 100%   LMP 01/10/2014 If your blood pressure (BP) was elevated above 135/85 this visit, please have this repeated by your doctor within one month. --------------

## 2014-02-01 NOTE — ED Notes (Signed)
Pt was tx and dx here for R ovarian cyst.  Pt states she has not been tx for it since and the pain is becoming unbearable.  Denies irregular bleeding or vaginal discharge.  LMP 01/14/14.
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
## 2014-02-01 NOTE — ED Notes (Signed)
Jody, LSW was in to talk with pt.

## 2014-02-01 NOTE — ED Notes (Signed)
Resident at bedside.  

## 2014-02-01 NOTE — Discharge Planning (Signed)
[facility] Community Liaison  Spoke to patient regarding primary care resources and the [facility] orange card. Patient was given the orange card application and instructed to contact me to further discuss the application. Resource guide and my contact information also provided for future questions or concerns. No other Community Liaison needs identified at this time.

## 2014-02-02 LAB — GC/CHLAMYDIA PROBE AMP
CT PROBE, AMP APTIMA: NEGATIVE
GC PROBE AMP APTIMA: NEGATIVE

## 2014-02-02 NOTE — ED Provider Notes (Signed)
Medical screening examination/treatment/procedure(s) were performed by non-physician practitioner and as supervising physician I was immediately available for consultation/collaboration.   EKG Interpretation None        Lyanne CoKevin M Andrus Sharp, MD 02/02/14 1040

## 2014-04-19 ENCOUNTER — Encounter (HOSPITAL_COMMUNITY): Payer: Self-pay | Admitting: Emergency Medicine

## 2014-08-01 ENCOUNTER — Emergency Department (HOSPITAL_COMMUNITY)
Admission: EM | Admit: 2014-08-01 | Discharge: 2014-08-02 | Disposition: A | Discharge status: Home / Self Care | Payer: Self-pay | Attending: Emergency Medicine | Admitting: Emergency Medicine

## 2014-08-01 ENCOUNTER — Encounter (HOSPITAL_COMMUNITY): Payer: Self-pay | Admitting: Emergency Medicine

## 2014-08-01 ENCOUNTER — Emergency Department (HOSPITAL_COMMUNITY): Payer: Self-pay

## 2014-08-01 DIAGNOSIS — Y92410 Unspecified street and highway as the place of occurrence of the external cause: Secondary | ICD-10-CM | POA: Insufficient documentation

## 2014-08-01 DIAGNOSIS — S29001A Unspecified injury of muscle and tendon of front wall of thorax, initial encounter: Secondary | ICD-10-CM | POA: Insufficient documentation

## 2014-08-01 DIAGNOSIS — Y9389 Activity, other specified: Secondary | ICD-10-CM | POA: Insufficient documentation

## 2014-08-01 DIAGNOSIS — Z720 Tobacco use: Secondary | ICD-10-CM | POA: Insufficient documentation

## 2014-08-01 DIAGNOSIS — M2560 Stiffness of unspecified joint, not elsewhere classified: Secondary | ICD-10-CM

## 2014-08-01 DIAGNOSIS — R52 Pain, unspecified: Secondary | ICD-10-CM

## 2014-08-01 DIAGNOSIS — Z8742 Personal history of other diseases of the female genital tract: Secondary | ICD-10-CM | POA: Insufficient documentation

## 2014-08-01 DIAGNOSIS — S4992XA Unspecified injury of left shoulder and upper arm, initial encounter: Secondary | ICD-10-CM | POA: Insufficient documentation

## 2014-08-01 DIAGNOSIS — S0083XA Contusion of other part of head, initial encounter: Secondary | ICD-10-CM | POA: Insufficient documentation

## 2014-08-01 DIAGNOSIS — R079 Chest pain, unspecified: Secondary | ICD-10-CM

## 2014-08-01 DIAGNOSIS — S8992XA Unspecified injury of left lower leg, initial encounter: Secondary | ICD-10-CM | POA: Insufficient documentation

## 2014-08-01 DIAGNOSIS — Y998 Other external cause status: Secondary | ICD-10-CM | POA: Insufficient documentation

## 2014-08-01 DIAGNOSIS — S79912A Unspecified injury of left hip, initial encounter: Secondary | ICD-10-CM | POA: Insufficient documentation

## 2014-08-01 MED ORDER — KETOROLAC TROMETHAMINE 10 MG PO TABS
10.0000 mg | ORAL_TABLET | Freq: Once | ORAL | Status: AC
  Administered 2014-08-01: 10 mg via ORAL
  Filled 2014-08-01: qty 1

## 2014-08-01 MED ORDER — OXYCODONE-ACETAMINOPHEN 5-325 MG PO TABS
2.0000 | ORAL_TABLET | Freq: Once | ORAL | Status: AC
  Administered 2014-08-01: 2 via ORAL
  Filled 2014-08-01: qty 2

## 2014-08-01 MED ORDER — METHOCARBAMOL 500 MG PO TABS: 500.0000 mg | ORAL_TABLET | Freq: Two times a day (BID) | ORAL | Status: DC

## 2014-08-01 MED ORDER — IBUPROFEN 800 MG PO TABS: 800.0000 mg | ORAL_TABLET | Freq: Three times a day (TID) | ORAL | Status: DC

## 2014-08-01 MED ORDER — OXYCODONE-ACETAMINOPHEN 10-325 MG PO TABS: 1.0000 | ORAL_TABLET | ORAL | Status: DC | PRN
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
## 2014-08-09 ENCOUNTER — Encounter (HOSPITAL_COMMUNITY): Payer: Self-pay | Admitting: Emergency Medicine

## 2014-08-09 ENCOUNTER — Emergency Department (HOSPITAL_COMMUNITY)
Admission: EM | Admit: 2014-08-09 | Discharge: 2014-08-09 | Disposition: A | Discharge status: Home / Self Care | Payer: Self-pay | Attending: Emergency Medicine | Admitting: Emergency Medicine

## 2014-08-09 DIAGNOSIS — Z720 Tobacco use: Secondary | ICD-10-CM | POA: Insufficient documentation

## 2014-08-09 DIAGNOSIS — Z791 Long term (current) use of non-steroidal anti-inflammatories (NSAID): Secondary | ICD-10-CM | POA: Insufficient documentation

## 2014-08-09 DIAGNOSIS — B9689 Other specified bacterial agents as the cause of diseases classified elsewhere: Secondary | ICD-10-CM

## 2014-08-09 DIAGNOSIS — Z3202 Encounter for pregnancy test, result negative: Secondary | ICD-10-CM | POA: Insufficient documentation

## 2014-08-09 DIAGNOSIS — N760 Acute vaginitis: Secondary | ICD-10-CM | POA: Insufficient documentation

## 2014-08-09 LAB — URINALYSIS, ROUTINE W REFLEX MICROSCOPIC
BILIRUBIN URINE: NEGATIVE
Glucose, UA: NEGATIVE mg/dL
Hgb urine dipstick: NEGATIVE
Ketones, ur: NEGATIVE mg/dL
Leukocytes, UA: NEGATIVE
Nitrite: NEGATIVE
PROTEIN: NEGATIVE mg/dL
SPECIFIC GRAVITY, URINE: 1.024 (ref 1.005–1.030)
Urobilinogen, UA: 1 mg/dL (ref 0.0–1.0)
pH: 6.5 (ref 5.0–8.0)

## 2014-08-09 LAB — POC URINE PREG, ED: PREG TEST UR: NEGATIVE

## 2014-08-09 LAB — WET PREP, GENITAL
TRICH WET PREP: NONE SEEN
YEAST WET PREP: NONE SEEN

## 2014-08-09 MED ORDER — FLUCONAZOLE 100 MG PO TABS: 200.0000 mg | ORAL_TABLET | Freq: Once | ORAL | Status: DC

## 2014-08-09 MED ORDER — METRONIDAZOLE 500 MG PO TABS
2000.0000 mg | ORAL_TABLET | Freq: Once | ORAL | Status: AC
  Administered 2014-08-09: 2000 mg via ORAL
  Filled 2014-08-09: qty 4

## 2014-08-09 MED ORDER — METRONIDAZOLE 500 MG PO TABS: 500.0000 mg | ORAL_TABLET | Freq: Two times a day (BID) | ORAL | Status: DC

## 2014-08-09 MED ORDER — ONDANSETRON 4 MG PO TBDP
4.0000 mg | ORAL_TABLET | Freq: Once | ORAL | Status: AC
Start: 2014-08-09 — End: 2014-08-09
  Administered 2014-08-09: 4 mg via ORAL
  Filled 2014-08-09: qty 1

## 2014-08-09 NOTE — ED Provider Notes (Signed)
CSN: [id number]     Arrival date & time 08/09/14  [id number] History   First MD Initiated Contact with Patient 08/09/14 [phone]     Chief Complaint  Patient presents with  . Vaginal Discharge     (Consider location/radiation/quality/duration/timing/severity/associated sxs/prior Treatment) HPI    PCP: No PCP Per Patient Blood pressure 104/61, pulse 96, temperature 97.9 F (36.6 C), temperature source Oral, resp. rate 16, last menstrual period 07/28/2014, SpO2 100 %.  Caitlin [name] is a 24 y.o.female with a significant PMH of ovarian cysts presents to the ER with complaints of vaginal discharge that is intermittent over the past several weeks. She can not remember exactly when but was tested and told she had trichomonas. She reports loosing the prescription and never taking the treatment. Now she is presenting because she has developed some mild discomfort and worsening discharge that is clear and watery.   Negative Review of Symptoms: fevers, nausea, vomiting, diarrhea, CP, SOB, confusion, weakness, abdominal pains, pelvic pain, vaginal bleeding.  Past Medical History  Diagnosis Date  . Ovarian cyst    Past Surgical History  Procedure Laterality Date  . Cesarean section  07/07/2007   History reviewed. No pertinent family history. History  Substance Use Topics  . Smoking status: Current Every Day Smoker    Types: Cigarettes  . Smokeless tobacco: Not on file  . Alcohol Use: No   OB History    Gravida Para Term Preterm AB TAB SAB Ectopic Multiple Living   1 1 1       1      Review of Systems  10 Systems reviewed and are negative for acute change except as noted in the HPI.    Allergies  Review of patient's allergies indicates no known allergies.  Home Medications   Prior to Admission medications   Medication Sig Start Date End Date Taking? Authorizing Provider  ibuprofen (ADVIL,MOTRIN) 800 MG tablet Take 1 tablet (800 mg total) by mouth 3 (three) times daily. 08/01/14  Yes  Hannah Muthersbaugh, PA-C  methocarbamol (ROBAXIN) 500 MG tablet Take 1 tablet (500 mg total) by mouth 2 (two) times daily. Patient taking differently: Take 500 mg by mouth 2 (two) times daily as needed for muscle spasms.  08/01/14  Yes Hannah Muthersbaugh, PA-C  oxyCODONE-acetaminophen (PERCOCET) 10-325 MG per tablet Take 1 tablet by mouth every 4 (four) hours as needed for pain. 08/01/14  Yes Hannah Muthersbaugh, PA-C  HYDROcodone-acetaminophen (NORCO/VICODIN) 5-325 MG per tablet Take 1-2 tablets every 6 hours as needed for severe pain Patient not taking: Reported on 08/09/2014 02/01/14   Renne CriglerJoshua Geiple, PA-C  ondansetron (ZOFRAN ODT) 4 MG disintegrating tablet Take 1 tablet (4 mg total) by mouth every 8 (eight) hours as needed for nausea or vomiting. Patient not taking: Reported on 08/09/2014 02/01/14   Renne CriglerJoshua Geiple, PA-C   BP 107/58 mmHg  Pulse 59  Temp(Src) 98.3 F (36.8 C) (Oral)  Resp 16  SpO2 100%  LMP 07/28/2014 Physical Exam  Constitutional: She appears well-developed and well-nourished. No distress.  HENT:  Head: Normocephalic and atraumatic.  Eyes: Pupils are equal, round, and reactive to light.  Neck: Normal range of motion. Neck supple.  Cardiovascular: Normal rate and regular rhythm.   Pulmonary/Chest: Effort normal.  Abdominal: Soft.  Genitourinary: Uterus normal. Cervix exhibits no motion tenderness, no discharge and no friability. Right adnexum displays no mass and no tenderness. Left adnexum displays no mass and no tenderness. No erythema or bleeding in the vagina. No foreign body around the  vagina. Vaginal discharge (white and curdy) found.  Neurological: She is alert.  Skin: Skin is warm and dry.  Nursing note and vitals reviewed.   ED Course  Procedures (including critical care time) Labs Review Labs Reviewed  WET PREP, GENITAL - Abnormal; Notable for the following:    Clue Cells Wet Prep HPF POC TOO NUMEROUS TO COUNT (*)    WBC, Wet Prep HPF POC FEW (*)    All  other components within normal limits  URINALYSIS, ROUTINE W REFLEX MICROSCOPIC  POC URINE PREG, ED  GC/CHLAMYDIA PROBE AMP ([place])    Imaging Review No results found.   EKG Interpretation None      MDM   Final diagnoses:  Bacterial vaginosis    Pt has a negative urine preg and urine analysis. Wet prep is positive for MANY clue cells. Medications  ondansetron (ZOFRAN-ODT) disintegrating tablet 4 mg (not administered)  metroNIDAZOLE (FLAGYL) tablet 2,000 mg (not administered)   Advised to practice safe sex and have all partners evaluated and treated at the local health department. Also advised to follow with the health Department in 1-2 weeks to confirm effectiveness of treatment and receive additional education/evaluation. Return precautions given.  23 [facility] [place]'s evaluation in the Emergency Department is complete. It has been determined that no acute conditions requiring further emergency intervention are present at this time. The patient/guardian have been advised of the diagnosis and plan. We have discussed signs and symptoms that warrant return to the ED, such as changes or worsening in symptoms.  Vital signs are stable at discharge. Filed Vitals:   08/09/14 1142  BP: 107/58  Pulse: 59  Temp: 98.3 F (36.8 C)  Resp: 16    Patient/guardian has voiced understanding and agreed to follow-up with the PCP or specialist.   Dorthula Matas, PA-C 08/09/14 1146  Flint Melter, MD 08/11/14 [phone]

## 2014-08-09 NOTE — ED Notes (Signed)
Pt c/o vaginal discharge x several weeks; pt seen here and treated for trichomonas and never got medication filled

## 2014-08-09 NOTE — ED Notes (Signed)
Pt also needs a note that states that she was seen here last Sunday-- needs a work note that states she was here.

## 2014-08-09 NOTE — Discharge Instructions (Signed)
Bacterial Vaginosis Bacterial vaginosis is a vaginal infection that occurs when the normal balance of bacteria in the vagina is disrupted. It results from an overgrowth of certain bacteria. This is the most common vaginal infection in women of childbearing age. Treatment is important to prevent complications, especially in pregnant women, as it can cause a premature delivery. CAUSES  Bacterial vaginosis is caused by an increase in harmful bacteria that are normally present in smaller amounts in the vagina. Several different kinds of bacteria can cause bacterial vaginosis. However, the reason that the condition develops is not fully understood. RISK FACTORS Certain activities or behaviors can put you at an increased risk of developing bacterial vaginosis, including:  Having a new sex partner or multiple sex partners.  Douching.  Using an intrauterine device (IUD) for contraception. Women do not get bacterial vaginosis from toilet seats, bedding, swimming pools, or contact with objects around them. SIGNS AND SYMPTOMS  Some women with bacterial vaginosis have no signs or symptoms. Common symptoms include:  Grey vaginal discharge.  A fishlike odor with discharge, especially after sexual intercourse.  Itching or burning of the vagina and vulva.  Burning or pain with urination. DIAGNOSIS  Your health care provider will take a medical history and examine the vagina for signs of bacterial vaginosis. A sample of vaginal fluid may be taken. Your health care provider will look at this sample under a microscope to check for bacteria and abnormal cells. A vaginal pH test may also be done.  TREATMENT  Bacterial vaginosis may be treated with antibiotic medicines. These may be given in the form of a pill or a vaginal cream. A second round of antibiotics may be prescribed if the condition comes back after treatment.  HOME CARE INSTRUCTIONS   Only take over-the-counter or prescription medicines as  directed by your health care provider.  If antibiotic medicine was prescribed, take it as directed. Make sure you finish it even if you start to feel better.  Do not have sex until treatment is completed.  Tell all sexual partners that you have a vaginal infection. They should see their health care provider and be treated if they have problems, such as a mild rash or itching.  Practice safe sex by using condoms and only having one sex partner. SEEK MEDICAL CARE IF:   Your symptoms are not improving after 3 days of treatment.  You have increased discharge or pain.  You have a fever. MAKE SURE YOU:   Understand these instructions.  Will watch your condition.  Will get help right away if you are not doing well or get worse. FOR MORE INFORMATION  Centers for Disease Control and Prevention, Division of STD Prevention: www.cdc.gov/std American Sexual Health Association (ASHA): www.ashastd.org  Document Released: 06/04/2005 Document Revised: 03/25/2013 Document Reviewed: 01/14/2013 ExitCare Patient Information 2015 ExitCare, LLC. This information is not intended to replace advice given to you by your health care provider. Make sure you discuss any questions you have with your health care provider.  

## 2014-08-10 LAB — GC/CHLAMYDIA PROBE AMP (~~LOC~~) NOT AT ARMC
CHLAMYDIA, DNA PROBE: NEGATIVE
Neisseria Gonorrhea: NEGATIVE

## 2014-12-03 ENCOUNTER — Encounter (HOSPITAL_COMMUNITY): Payer: Self-pay | Admitting: *Deleted

## 2014-12-03 ENCOUNTER — Emergency Department (HOSPITAL_COMMUNITY)
Admission: EM | Admit: 2014-12-03 | Discharge: 2014-12-04 | Disposition: A | Discharge status: Home / Self Care | Payer: Self-pay | Attending: Emergency Medicine | Admitting: Emergency Medicine

## 2014-12-03 DIAGNOSIS — F419 Anxiety disorder, unspecified: Secondary | ICD-10-CM | POA: Insufficient documentation

## 2014-12-03 DIAGNOSIS — Y929 Unspecified place or not applicable: Secondary | ICD-10-CM | POA: Insufficient documentation

## 2014-12-03 DIAGNOSIS — Y939 Activity, unspecified: Secondary | ICD-10-CM | POA: Insufficient documentation

## 2014-12-03 DIAGNOSIS — Z8742 Personal history of other diseases of the female genital tract: Secondary | ICD-10-CM | POA: Insufficient documentation

## 2014-12-03 DIAGNOSIS — IMO0001 Reserved for inherently not codable concepts without codable children: Secondary | ICD-10-CM

## 2014-12-03 DIAGNOSIS — T719XXA Asphyxiation due to unspecified cause, initial encounter: Secondary | ICD-10-CM | POA: Insufficient documentation

## 2014-12-03 DIAGNOSIS — Y999 Unspecified external cause status: Secondary | ICD-10-CM | POA: Insufficient documentation

## 2014-12-03 DIAGNOSIS — Z720 Tobacco use: Secondary | ICD-10-CM | POA: Insufficient documentation
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
## 2014-12-04 MED ORDER — ACETAMINOPHEN 500 MG PO TABS
1000.0000 mg | ORAL_TABLET | Freq: Once | ORAL | Status: AC
  Administered 2014-12-04: 1000 mg via ORAL
  Filled 2014-12-04: qty 2

## 2014-12-04 NOTE — ED Notes (Signed)
Gave pt something to drink and sandwich

## 2014-12-04 NOTE — Discharge Instructions (Signed)
Soft Tissue Injury of the Neck °A soft tissue injury of the neck may be either blunt or penetrating. A blunt injury does not break the skin. A penetrating injury breaks the skin, creating an open wound. Blunt injuries may happen in several ways. Most involve some type of direct blow to the neck. This can cause serious injury to the windpipe, voice box, cervical spine, or esophagus. In some cases, the injury to the soft tissue can also result in a break (fracture) of the cervical spine.  °Soft tissue injuries of the neck require immediate medical care. Sometimes, you may not notice the signs of injury right away. You may feel fine at first, but the swelling may eventually close off your airway. This could result in a significant or life-threatening injury. This is rare, but it is important to keep in mind with any injury to the neck.  °CAUSES  °Causes of blunt injury may include: °· "Clothesline" injuries. This happens when someone is moving at high speed and runs into a clothesline, outstretched arm, or similar object. This results in a direct injury to the front of the neck. If the airway is blocked, it can cause suffocation due to lack of oxygen (asphyxiation) or even instant death. °· High-energy trauma. This includes injuries from motor vehicle crashes, falling from a great height, or heavy objects falling onto the neck. °· Sports-related injuries. Injury to the windpipe and voice box can result from being struck by another player or being struck by an object, such as a baseball, hockey stick, or an outstretched arm. °· Strangulation. This type of injury may cause skin trauma, hoarseness of voice, or broken cartilage in the voice box or windpipe. It may also cause a serious airway problem. °SYMPTOMS  °· Bruising. °· Pain and tenderness in the neck. °· Swelling of the neck and face. °· Hoarseness of voice. °· Pain or difficulty with swallowing. °· Drooling or inability to swallow. °· Trouble breathing. This may  become worse when lying flat. °· Coughing up blood. °· High-pitched, harsh, vibratory noise due to partial obstruction of the windpipe (stridor). °· Swelling of the upper arms. °· Windpipe that appears to be pushed off to one side. °· Air in the tissues under the skin of the neck or chest (subcutaneous emphysema). This usually indicates a problem with the normal airway and is a medical emergency. °DIAGNOSIS  °· If possible, your caregiver may ask about the details of how the injury occurred. A detailed exam can help to identify specific areas of the neck that are injured. °· Your caregiver may ask for tests to rule out injury of the voice box, airway, or esophagus. This may include X-rays, ultrasounds, CT scans, or MRI scans, depending on the severity of your injury. °TREATMENT  °If you have an injury to your windpipe or voice box, immediate medical care is required. In almost all cases, hospitalization is necessary. For injuries that do not appear to require surgery, it is helpful to have medical observation for 24 hours. You may be asked to do one or more of the following: °· Rest your voice. °· Bed rest. °· Limit your diet, depending on the extent of the injury. Follow your caregiver's dietary guidelines. Often, only fluids and soft foods are recommended. °· Keep your head raised. °· Breathe humidified air. °· Take medicines to control infection, reduce swelling, and reduce normal stomach acid. You may also need pain medicine, depending on your injury. °For injuries that appear to require surgery,   you will need to stay in the hospital. The exact type of procedure needed will depend on your exact injury or injuries.  HOME CARE INSTRUCTIONS   If the skin was broken, keep the wound area clean and dry. Wear your bandage (dressing) and care for your wound as instructed.  Follow your caregiver's advice about your diet.  Follow your caregiver's advice about use of your voice.  Take medicines as  directed.  Keep your head and neck at least partially raised (elevated) while recovering. This should also be done while sleeping. SEEK MEDICAL CARE IF:   Your voice becomes weaker.  Your swelling or bruising is not improving as expected. Typically, this takes several days to improve.  You feel that you are having problems with medicines prescribed.  You have drainage from the injury site. This may be a sign that your wound is not healing properly or is infected.  You develop increasing pain or difficulty while swallowing.  You develop an oral temperature of 102 F (38.9 C) or higher. SEEK IMMEDIATE MEDICAL CARE IF:   You cough up blood.  You develop sudden trouble breathing.  You cannot tolerate your oral medicines, or you are unable to swallow.  You develop drooling.  You have new or worsening vomiting.  You develop sudden, new swelling of the neck or face.  You have an oral temperature above 102 F (38.9 C), not controlled by medicine. MAKE SURE YOU:  Understand these instructions.  Will watch your condition.  Will get help right away if you are not doing well or get worse. Document Released: 09/11/2007 Document Revised: 08/27/2011 Document Reviewed: 08/21/2010 [facility] Patient Information 2015 [place], [place]. This information is not intended to replace advice given to you by your health care provider. Make sure you discuss any questions you have with your health care provider.   Emergency Department Resource Guide 1) Find a Doctor and Pay Out of Pocket Although you won't have to find out who is covered by your insurance plan, it is a good idea to ask around and get recommendations. You will then need to call the office and see if the doctor you have chosen will accept you as a new patient and what types of options they offer for patients who are self-pay. Some doctors offer discounts or will set up payment plans for their patients who do not have insurance, but you  will need to ask so you aren't surprised when you get to your appointment.  2) Contact Your Local Health Department Not all health departments have doctors that can see patients for sick visits, but many do, so it is worth a call to see if yours does. If you don't know where your local health department is, you can check in your phone book. The CDC also has a tool to help you locate your state's health department, and many state websites also have listings of all of their local health departments.  3) Find a Walk-in Clinic If your illness is not likely to be very severe or complicated, you may want to try a walk in clinic. These are popping up all over the country in pharmacies, drugstores, and shopping centers. They're usually staffed by nurse practitioners or physician assistants that have been trained to treat common illnesses and complaints. They're usually fairly quick and inexpensive. However, if you have serious medical issues or chronic medical problems, these are probably not your best option.  No Primary Care Doctor: - Call Health Connect at  [phone] - they can help you locate a primary care doctor that  accepts your insurance, provides certain services, etc. - Physician Referral Service- [phone]  Chronic Pain Problems: Organization         Address  Phone   Notes  [facility]  [phone]) [phone] Patients need to be referred by their primary care doctor.   Medication Assistance: Organization         Address  Phone   Notes  [facility]ication [facility] [address] [place], [place] [address] [phone] --Must be a resident of [facility] -- Must have NO insurance coverage whatsoever (no Medicaid/ Medicare, etc.) -- The pt. MUST have a primary care doctor that directs their care regularly and follows them in the community   MedAssist  [phone]) [phone]   [employer]  [phone]    Agencies that provide inexpensive medical  care: Organization         Address  Phone   Notes  [facility]  [phone]) [phone]   [facility]    [phone]   [facility] [address] [place], [place] [address] [phone]) [phone]   [facility] [place] [address], [place] [phone]   [facility]anned [facility]    [phone](336) [phone]   [facility]    [phone]   [facility]  [address], [place] Phone:  [phone]) [phone], Fax:  [phone] Hours of Operation:  9 am - 6 pm, M-F.  Also accepts Medicaid/Medicare and self-pay.  [facility]  [address], [place] Phone: [phone], Fax: [phone]. Hours of Operation:  8:30 am - 5:30 pm, M-F.  Also accepts Medicaid and self-pay.  [facility] [place] [address], [place] Phone: [phone]) [phone]   [facility] [address], [place], [place] [phone], Ext. [phone] Mondays & Thursdays: 7-9 AM.  First 15 patients are seen on a first come, first serve basis.    Medicaid-accepting [facility] Providers:  Organization         Address  Phone   Notes  [facility] [address], [place] [phone] Also accepts self-pay patients.  [facility] [address], [place]  [phone]   [facility] [address], [place] [phone]) [phone]   [facility] [address] [place] [address], [place] [phone]) [phone]   Renaye RakersVeita Bland [address], [place]   [phone]) [phone] Only accepts [place] Access [place] patients after they have their name applied to their card.   Self-Pay (no insurance) in [facility] [place]:  Organization         Address  Phone   Notes  Sickle Cell Patients, [facility] [address], [place] [phone]) [phone]   [facility] [address],  [place] [phone]   [facility] [place]  [address], [place] [phone]   [facility]/Dr. Osei-Bonsu  [address] [place] [address], [place] or [id number], [place] [phone] Phone number for both [place] and [place] locations is the same.  [facility] and [facility] [address], [place] [phone]) [phone]   [facility] [place] [address] [place] [address], [place]  or [address] [phone]   [facility] [address], [place] [phone], phone; [phone], fax Sees patients 1st and 3rd Saturday of every month.  Must not qualify for public or private insurance (i.e. Medicaid, Medicare, [place] Health Choice, Veterans' Benefits)  Household income should be no more than 200% of the poverty level The clinic cannot treat you if you are pregnant or think you are pregnant  Sexually transmitted diseases are not treated at the clinic.    Dental Care: Organization         Address  Phone  Notes  [facility] Department of [facility] [address] [place], [place] [phone] Accepts children up to age 21 who are enrolled in [place] or [place] Health Choice; pregnant women with a Medicaid card; and children who have applied for Medicaid or [place] Health Choice, but were declined, whose parents can pay a reduced fee at time of service.  [facility] of [facility]  [address], [place] [phone] Accepts children up to age 11 who are enrolled in [place] or [place] Health Choice; pregnant women with a Medicaid card; and children who have applied for Medicaid or [place] Health Choice, but were declined, whose parents can pay a reduced fee at time of service.  [facility]  [address] [place], [place] [phone] Patients are seen by appointment only. Walk-ins are not accepted. [facility] will see patients 54 years of age and older. Monday - Tuesday (8am-5pm) Most Wednesdays (8:30-5pm) $30 per visit, cash only  [facility] Adult [facility] Access PROGRAM  [address], [facility] [phone] Patients are seen by appointment only. Walk-ins are not accepted. [facility] will see patients 83 years of age and older. One Wednesday Evening (Monthly: Volunteer Based).  $30 per visit, cash only  [employer]  [phone] for adults; Children under age 49, call Graduate Pediatric Dentistry at [phone]. Children aged 48-14, please call [phone] to request a pediatric application.  Dental services are provided in all areas of dental care including fillings, crowns and bridges, complete and partial dentures, implants, gum treatment, root canals, and extractions. Preventive care is also provided. Treatment is provided to both adults and children. Patients are selected via a lottery and there is often a waiting list.   [facility] [address] [place]  [phone] [email or web address]   [facility] [address] [place], [place] [phone], Ext. [phone] Second and Fourth Thursday of each month, opens at 6:30 AM; Clinic ends at 9 AM.  Patients are seen on a first-come first-served basis, and a limited number are seen during each clinic.   [facility]  [address] [place], [place] [phone]   Eligibility Requirements You must have lived in [place], [place], or [place] counties for at least the last three months.   You cannot be eligible for state or federal sponsored [place], including [employer], [place], or [employer].   You generally cannot be eligible for healthcare insurance through your employer.    How to apply: Eligibility screenings are held every Tuesday and Wednesday afternoon from 1:00 pm until 4:00 pm. You do not need an appointment for the interview!   [facility] [address] [place], [place] [phone]   [facility] Department  [phone]  [facility]  [phone]   [facility]  [phone]    Behavioral Health Resources in the Community: Intensive Outpatient Programs Organization         Address  Phone  Notes  [facility] [address], [place], [place] [phone]   [facility] [address], [place], [place] [phone]   [facility] [address], [place], [place]  [phone]   [facility] [address],  [place], [place] [phone] or [phone]   Substance Abuse Resources Organization         Address  Phone  Notes  [facility]  [phone]   [facility]  [phone]   [facility]  [phone]   [facility]  [phone]   Residential & Outpatient Substance Abuse Program  [phone]   Psychological Services Organization         Address  Phone  Notes  [facility] [place] [facility]  [phone]- [phone]   [facility]  [phone]- [phone]   [facility] [address], [place] [phone] or [phone]    Mobile Crisis Teams Organization         Address  Phone  Notes  [facility], Mobile Crisis Care Unit  [phone]   [facility]  [address] [place], [place] [phone]   Doristine LocksSharon DeEsch [address] [place] [phone]    Self-Help/Support Groups Organization         Address  Phone             Notes  [facility] of [place] - variety of support groups  336- [phone] Call for more information  Narcotics Anonymous (NA), [facility] [address], [place]  2 meetings at this location   Statisticianesidential Treatment Programs Organization         Address  Phone  Notes  [facility] [address],    [place]  [phone]   [facility]  [address], [place] [id number], [place], [place] [phone]   [facility] [address], [place] [phone] Admissions: 8am-3pm M-F  [facility] [address]    [place], [place] [phone]   [facility] [address], [place], [place] [phone]   [facility] [address]  [place], [place] [phone]   [facility] - Intensive Outpatient [address], [place], [place] [phone]   [facility] ([facility]iction [facility] [address]  [place], [place] [phone] or [phone]   [facility] (RT[facility]) [address] [place], [place] [phone] Accepts Medicaid  [facility] [address]  [place] [phone] Substance Abuse/Addiction Treatment   [facility] Behavioral Health Resources Organization         Address  Phone  Notes  [facility]  [phone]   Angie FavaJulie Brannon, PhD [address], [address] [place], [place]   [phone]) [phone] or [phone](336) [phone]   [facility] [place]   [address] [place], [place] [phone](336) [phone]   [facility] [address], [place], [place] [phone]) [phone] Insurance/Medicaid/sponsorship through [employer] [facility]Center[facility]  Faith and Families [address]  [place], [place] [phone] [place], [place] [phone]    Dr. Adele Schilder  [phone]   [facility] Dept. 1) [address] [place] 2) [place] 3)  [address], [place] [phone]  [phone]   [place] [phone] or [phone] (After Hours)
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
## 2015-08-09 ENCOUNTER — Encounter (HOSPITAL_COMMUNITY): Payer: Self-pay | Admitting: Emergency Medicine

## 2015-08-09 ENCOUNTER — Emergency Department (HOSPITAL_COMMUNITY)
Admission: EM | Admit: 2015-08-09 | Discharge: 2015-08-09 | Disposition: A | Discharge status: Home / Self Care | Payer: Self-pay | Attending: Emergency Medicine | Admitting: Emergency Medicine

## 2015-08-09 ENCOUNTER — Emergency Department (HOSPITAL_COMMUNITY): Payer: Self-pay

## 2015-08-09 DIAGNOSIS — S4991XA Unspecified injury of right shoulder and upper arm, initial encounter: Secondary | ICD-10-CM | POA: Insufficient documentation

## 2015-08-09 DIAGNOSIS — F17210 Nicotine dependence, cigarettes, uncomplicated: Secondary | ICD-10-CM | POA: Insufficient documentation

## 2015-08-09 DIAGNOSIS — Y9389 Activity, other specified: Secondary | ICD-10-CM | POA: Insufficient documentation

## 2015-08-09 DIAGNOSIS — Y998 Other external cause status: Secondary | ICD-10-CM | POA: Insufficient documentation

## 2015-08-09 DIAGNOSIS — Y9289 Other specified places as the place of occurrence of the external cause: Secondary | ICD-10-CM | POA: Insufficient documentation

## 2015-08-09 DIAGNOSIS — Z8742 Personal history of other diseases of the female genital tract: Secondary | ICD-10-CM | POA: Insufficient documentation

## 2015-08-09 DIAGNOSIS — S4992XA Unspecified injury of left shoulder and upper arm, initial encounter: Secondary | ICD-10-CM | POA: Insufficient documentation

## 2015-08-09 MED ORDER — IBUPROFEN 800 MG PO TABS
800.0000 mg | ORAL_TABLET | Freq: Once | ORAL | Status: AC
Start: 2015-08-09 — End: 2015-08-09
  Administered 2015-08-09: 800 mg via ORAL
  Filled 2015-08-09: qty 1
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
## 2015-08-09 NOTE — ED Notes (Signed)
Bed: WA29 Expected date:  Expected time:  Means of arrival:  Comments: 

## 2015-08-09 NOTE — Progress Notes (Signed)
CM spoke with pt who confirms uninsured [place] resident with no pcp.  CM discussed and provided written information for uninsured accepting pcps, discussed the importance of pcp vs EDP services for f/u care, www.needymeds.org, www.goodrx.com, discounted pharmacies and other [place] resources such as [facility] , [facility], affordable care act, financial assistance, uninsured dental services, [place] med assist, DSS and  health department  Reviewed resources for [place] uninsured accepting pcps like [facility], family medicine at [address], [facility], [facility], local urgent care centers, [facility], [facility], general medical clinics, [facility] the [place], [facility] plus others, medication resources, [facility] out patient pharmacies and housing Pt voiced understanding and appreciation of resources provided   Provided [facility] contact information Pt agreed to a referral Cm completed referral Pt to be contact by [facility] clinical liason  
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
## 2015-08-29 ENCOUNTER — Encounter (HOSPITAL_COMMUNITY): Payer: Self-pay | Admitting: Emergency Medicine

## 2015-08-29 ENCOUNTER — Emergency Department (HOSPITAL_COMMUNITY)
Admission: EM | Admit: 2015-08-29 | Discharge: 2015-08-29 | Disposition: A | Discharge status: Home / Self Care | Payer: No Typology Code available for payment source | Attending: Emergency Medicine | Admitting: Emergency Medicine

## 2015-08-29 DIAGNOSIS — N898 Other specified noninflammatory disorders of vagina: Secondary | ICD-10-CM | POA: Insufficient documentation

## 2015-08-29 DIAGNOSIS — F17210 Nicotine dependence, cigarettes, uncomplicated: Secondary | ICD-10-CM | POA: Insufficient documentation

## 2015-08-29 NOTE — ED Notes (Signed)
No answer from waiting roomx2

## 2015-08-29 NOTE — ED Notes (Signed)
Patient call twice with response.

## 2015-08-29 NOTE — ED Notes (Signed)
Patient presents for vaginal discharge x2-3 months. Treated for BV Feb '16 and states she doesn't feel it got better. Denies dysuria, urinary frequency, hematuria, or vaginal bleeding. Denies N/V, fever/chills.

## 2015-10-18 ENCOUNTER — Encounter (HOSPITAL_COMMUNITY): Payer: Self-pay | Admitting: Adult Health

## 2015-10-18 ENCOUNTER — Emergency Department (HOSPITAL_COMMUNITY)
Admission: EM | Admit: 2015-10-18 | Discharge: 2015-10-18 | Disposition: A | Discharge status: Home / Self Care | Payer: No Typology Code available for payment source | Attending: Emergency Medicine | Admitting: Emergency Medicine

## 2015-10-18 DIAGNOSIS — Z8742 Personal history of other diseases of the female genital tract: Secondary | ICD-10-CM | POA: Insufficient documentation

## 2015-10-18 DIAGNOSIS — Z8619 Personal history of other infectious and parasitic diseases: Secondary | ICD-10-CM | POA: Insufficient documentation

## 2015-10-18 DIAGNOSIS — Z3202 Encounter for pregnancy test, result negative: Secondary | ICD-10-CM | POA: Insufficient documentation

## 2015-10-18 DIAGNOSIS — N898 Other specified noninflammatory disorders of vagina: Secondary | ICD-10-CM | POA: Insufficient documentation

## 2015-10-18 DIAGNOSIS — F17210 Nicotine dependence, cigarettes, uncomplicated: Secondary | ICD-10-CM | POA: Insufficient documentation

## 2015-10-18 LAB — WET PREP, GENITAL
Sperm: NONE SEEN
Trich, Wet Prep: NONE SEEN
YEAST WET PREP: NONE SEEN

## 2015-10-18 LAB — URINALYSIS, ROUTINE W REFLEX MICROSCOPIC
Bilirubin Urine: NEGATIVE
Glucose, UA: NEGATIVE mg/dL
Hgb urine dipstick: NEGATIVE
Ketones, ur: NEGATIVE mg/dL
LEUKOCYTES UA: NEGATIVE
Nitrite: NEGATIVE
PROTEIN: NEGATIVE mg/dL
Specific Gravity, Urine: 1.022 (ref 1.005–1.030)
pH: 7 (ref 5.0–8.0)

## 2015-10-18 LAB — GC/CHLAMYDIA PROBE AMP (~~LOC~~) NOT AT ARMC
CHLAMYDIA, DNA PROBE: NEGATIVE
NEISSERIA GONORRHEA: NEGATIVE

## 2015-10-18 LAB — URINE MICROSCOPIC-ADD ON

## 2015-10-18 LAB — POC URINE PREG, ED: PREG TEST UR: NEGATIVE

## 2015-10-18 MED ORDER — FLUCONAZOLE 150 MG PO TABS: ORAL_TABLET | ORAL | Status: DC

## 2015-10-18 MED ORDER — AZITHROMYCIN 250 MG PO TABS
1000.0000 mg | ORAL_TABLET | Freq: Once | ORAL | Status: AC
  Administered 2015-10-18: 1000 mg via ORAL
  Filled 2015-10-18: qty 4

## 2015-10-18 MED ORDER — METRONIDAZOLE 500 MG PO TABS
2000.0000 mg | ORAL_TABLET | Freq: Once | ORAL | Status: AC
  Administered 2015-10-18: 2000 mg via ORAL
  Filled 2015-10-18: qty 4

## 2015-10-18 MED ORDER — CEFTRIAXONE SODIUM 250 MG IJ SOLR
250.0000 mg | Freq: Once | INTRAMUSCULAR | Status: AC
  Administered 2015-10-18: 250 mg via INTRAMUSCULAR
  Filled 2015-10-18: qty 250

## 2015-10-18 NOTE — Discharge Instructions (Signed)
Vaginitis Vaginitis is an inflammation of the vagina. It can happen when the normal bacteria and yeast in the vagina grow too much. There are different types. Treatment will depend on the type you have. HOME CARE  Take all medicines as told by your doctor.  Keep your vagina area clean and dry. Avoid soap. Rinse the area with water.  Avoid washing and cleaning out the vagina (douching).  Do not use tampons or have sex (intercourse) until your treatment is done.  Wipe from front to back after going to the restroom.  Wear cotton underwear.  Avoid wearing underwear while you sleep until your vaginitis is gone.  Avoid tight pants. Avoid underwear or nylons without a cotton panel.  Take off wet clothing (such as a bathing suit) as soon as you can.  Use mild, unscented products. Avoid fabric softeners and scented:  Feminine sprays.  Laundry detergents.  Tampons.  Soaps or bubble baths.  Practice safe sex and use condoms. GET HELP RIGHT AWAY IF:   You have belly (abdominal) pain.  You have a fever or lasting symptoms for more than 2-3 days.  You have a fever and your symptoms suddenly get worse. MAKE SURE YOU:   Understand these instructions.  Will watch this condition.  Will get help right away if you are not doing well or get worse.   This information is not intended to replace advice given to you by your health care provider. Make sure you discuss any questions you have with your health care provider.   Document Released: 08/31/2008 Document Revised: 02/27/2012 Document Reviewed: 11/15/2011 Elsevier Interactive Patient Education 2016 Elsevier Inc.  Bacterial Vaginosis Bacterial vaginosis is an infection of the vagina. It happens when too many germs (bacteria) grow in the vagina. Having this infection puts you at risk for getting other infections from sex. Treating this infection can help lower your risk for other infections, such as:    Chlamydia.  Gonorrhea.  HIV.  Herpes. HOME CARE  Take your medicine as told by your doctor.  Finish your medicine even if you start to feel better.  Tell your sex partner that you have an infection. They should see their doctor for treatment.  During treatment:  Avoid sex or use condoms correctly.  Do not douche.  Do not drink alcohol unless your doctor tells you it is ok.  Do not breastfeed unless your doctor tells you it is ok. GET HELP IF:  You are not getting better after 3 days of treatment.  You have more grey fluid (discharge) coming from your vagina than before.  You have more pain than before.  You have a fever. MAKE SURE YOU:   Understand these instructions.  Will watch your condition.  Will get help right away if you are not doing well or get worse.   This information is not intended to replace advice given to you by your health care provider. Make sure you discuss any questions you have with your health care provider.   Document Released: 03/13/2008 Document Revised: 06/25/2014 Document Reviewed: 01/14/2013 Elsevier Interactive Patient Education 2016 Elsevier Inc.  Monilial Vaginitis Vaginitis in a soreness, swelling and redness (inflammation) of the vagina and vulva. Monilial vaginitis is not a sexually transmitted infection. CAUSES  Yeast vaginitis is caused by yeast (candida) that is normally found in your vagina. With a yeast infection, the candida has overgrown in number to a point that upsets the chemical balance. SYMPTOMS   White, thick vaginal discharge.  Swelling,  itching, redness and irritation of the vagina and possibly the lips of the vagina (vulva).  Burning or painful urination.  Painful intercourse. DIAGNOSIS  Things that may contribute to monilial vaginitis are:  Postmenopausal and virginal states.  Pregnancy.  Infections.  Being tired, sick or stressed, especially if you had monilial vaginitis in the  past.  Diabetes. Good control will help lower the chance.  Birth control pills.  Tight fitting garments.  Using bubble bath, feminine sprays, douches or deodorant tampons.  Taking certain medications that kill germs (antibiotics).  Sporadic recurrence can occur if you become ill. TREATMENT  Your caregiver will give you medication.  There are several kinds of anti monilial vaginal creams and suppositories specific for monilial vaginitis. For recurrent yeast infections, use a suppository or cream in the vagina 2 times a week, or as directed.  Anti-monilial or steroid cream for the itching or irritation of the vulva may also be used. Get your caregiver's permission.  Painting the vagina with methylene blue solution may help if the monilial cream does not work.  Eating yogurt may help prevent monilial vaginitis. HOME CARE INSTRUCTIONS   Finish all medication as prescribed.  Do not have sex until treatment is completed or after your caregiver tells you it is okay.  Take warm sitz baths.  Do not douche.  Do not use tampons, especially scented ones.  Wear cotton underwear.  Avoid tight pants and panty hose.  Tell your sexual partner that you have a yeast infection. They should go to their caregiver if they have symptoms such as mild rash or itching.  Your sexual partner should be treated as well if your infection is difficult to eliminate.  Practice safer sex. Use condoms.  Some vaginal medications cause latex condoms to fail. Vaginal medications that harm condoms are:  Cleocin cream.  Butoconazole (Femstat).  Terconazole (Terazol) vaginal suppository.  Miconazole (Monistat) (may be purchased over the counter). SEEK MEDICAL CARE IF:   You have a temperature by mouth above 102 F (38.9 C).  The infection is getting worse after 2 days of treatment.  The infection is not getting better after 3 days of treatment.  You develop blisters in or around your  vagina.  You develop vaginal bleeding, and it is not your menstrual period.  You have pain when you urinate.  You develop intestinal problems.  You have pain with sexual intercourse.   This information is not intended to replace advice given to you by your health care provider. Make sure you discuss any questions you have with your health care provider.   Document Released: 03/14/2005 Document Revised: 08/27/2011 Document Reviewed: 12/06/2014 Elsevier Interactive Patient Education 2016 [employer].  Sexually Transmitted Disease A sexually transmitted disease (STD) is a disease or infection often passed to another person during sex. However, STDs can be passed through nonsexual ways. An STD can be passed through:  Spit (saliva).  Semen.  Blood.  Mucus from the vagina.  Pee (urine). HOW CAN I LESSEN MY CHANCES OF GETTING AN STD?  Use:  Latex condoms.  Water-soluble lubricants with condoms. Do not use petroleum jelly or oils.  Dental dams. These are small pieces of latex that are used as a barrier during oral sex.  Avoid having more than one sex partner.  Do not have sex with someone who has other sex partners.  Do not have sex with anyone you do not know or who is at high risk for an STD.  Avoid risky sex  that can break your skin.  Do not have sex if you have open sores on your mouth or skin.  Avoid drinking too much alcohol or taking illegal drugs. Alcohol and drugs can affect your good judgment.  Avoid oral and anal sex acts.  Get shots (vaccines) for HPV and hepatitis.  If you are at risk of being infected with HIV, it is advised that you take a certain medicine daily to prevent HIV infection. This is called pre-exposure prophylaxis (PrEP). You may be at risk if:  You are a man who has sex with other men (MSM).  You are attracted to the opposite sex (heterosexual) and are having sex with more than one partner.  You take drugs with a needle.  You have  sex with someone who has HIV.  Talk with your doctor about if you are at high risk of being infected with HIV. If you begin to take PrEP, get tested for HIV first. Get tested every 3 months for as long as you are taking PrEP.  Get tested for STDs every year if you are sexually active. If you are treated for an STD, get tested again 3 months after you are treated. WHAT SHOULD I DO IF I THINK I HAVE AN STD?  See your doctor.  Tell your sex partner(s) that you have an STD. They should be tested and treated.  Do not have sex until your doctor says it is okay. WHEN SHOULD I GET HELP? Get help right away if:  You have bad belly (abdominal) pain.  You are a man and have puffiness (swelling) or pain in your testicles.  You are a woman and have puffiness in your vagina.   This information is not intended to replace advice given to you by your health care provider. Make sure you discuss any questions you have with your health care provider.   Document Released: 07/12/2004 Document Revised: 06/25/2014 Document Reviewed: 11/28/2012 Elsevier Interactive Patient Education [employer]

## 2015-10-18 NOTE — ED Provider Notes (Signed)
CSN: [id number]     Arrival date & time 10/18/15  0204 History   First MD Initiated Contact with Patient 10/18/15 0501     Chief Complaint  Patient presents with  . Vaginal Discharge     (Consider location/radiation/quality/duration/timing/severity/associated sxs/prior Treatment) HPI   Caitlin [name] is a 25 y/o female, presents to the ER with over 1 year of vaginal discharge with foul odor after treatment for trichomonas.  Pt states she has on female partner for over a year, no possibility of pregnancy, LMP 10/04/15.  She denies abdominal pain, pelvic pain, dysuria, flank pain, fever, chills, sweats, nausea, vomiting.  No other complaints.    Past Medical History  Diagnosis Date  . Ovarian cyst    Past Surgical History  Procedure Laterality Date  . Cesarean section  07/07/2007   History reviewed. No pertinent family history. Social History  Substance Use Topics  . Smoking status: Current Every Day Smoker    Types: Cigarettes  . Smokeless tobacco: None  . Alcohol Use: No   OB History    Gravida Para Term Preterm AB TAB SAB Ectopic Multiple Living   Review of Systems  All other systems reviewed and are negative.     Allergies  Review of patient's allergies indicates no known allergies.  Home Medications   Prior to Admission medications   Medication Sig Start Date End Date Taking? Authorizing Provider  methocarbamol (ROBAXIN) 500 MG tablet Take 1 tablet (500 mg total) by mouth 2 (two) times daily. Patient taking differently: Take 500 mg by mouth 2 (two) times daily as needed for muscle spasms.  08/01/14   Hannah Muthersbaugh, PA-C   BP 123/96 mmHg  Pulse 87  Temp(Src) 97.8 F (36.6 C) (Oral)  Resp 18  SpO2 99%  LMP 10/04/2015 (Approximate) Physical Exam  Constitutional: She is oriented to person, place, and time. She appears well-developed and well-nourished. No distress.  HENT:  Head: Normocephalic and atraumatic.  Nose: Nose normal.   Mouth/Throat: Oropharynx is clear and moist. No oropharyngeal exudate.  Eyes: Conjunctivae and EOM are normal. Pupils are equal, round, and reactive to light. Right eye exhibits no discharge. Left eye exhibits no discharge. No scleral icterus.  Neck: Normal range of motion. No JVD present. No tracheal deviation present. No thyromegaly present.  Cardiovascular: Normal rate, regular rhythm, normal heart sounds and intact distal pulses.  Exam reveals no gallop and no friction rub.   No murmur heard. Pulmonary/Chest: Effort normal and breath sounds normal. No respiratory distress. She has no wheezes. She has no rales. She exhibits no tenderness.  Abdominal: Soft. Bowel sounds are normal. She exhibits no distension and no mass. There is no tenderness. There is no rebound and no guarding.  Genitourinary: Uterus normal. There is no rash or tenderness on the right labia. There is no rash or tenderness on the left labia. Uterus is not enlarged and not tender. Cervix exhibits discharge. Cervix exhibits no motion tenderness and no friability. Right adnexum displays no mass, no tenderness and no fullness. Left adnexum displays no mass, no tenderness and no fullness. No erythema, tenderness or bleeding in the vagina. No signs of injury around the vagina. Vaginal discharge found.  Copious thick, white, near waxy appearing vaginal discharge  Musculoskeletal: Normal range of motion. She exhibits no edema or tenderness.  Lymphadenopathy:    She has no cervical adenopathy.       Right: No  inguinal adenopathy present.       Left: No inguinal adenopathy present.  Neurological: She is alert and oriented to person, place, and time. She has normal reflexes. No cranial nerve deficit. She exhibits normal muscle tone. Coordination normal.  Skin: Skin is warm and dry. No rash noted. She is not diaphoretic. No erythema. No pallor.  Psychiatric: She has a normal mood and affect. Her behavior is normal. Judgment and thought  content normal.  Nursing note and vitals reviewed.   ED Course  Procedures (including critical care time) Labs Review Labs Reviewed  URINALYSIS, ROUTINE W REFLEX MICROSCOPIC (NOT AT [facility]) - Abnormal; Notable for the following:    APPearance TURBID (*)    All other components within normal limits  URINE MICROSCOPIC-ADD ON - Abnormal; Notable for the following:    Squamous Epithelial / LPF 0-5 (*)    Bacteria, UA FEW (*)    All other components within normal limits  WET PREP, GENITAL  POC URINE PREG, ED  GC/CHLAMYDIA PROBE AMP ([facility]) NOT AT [facility]    Imaging Review No results found. I have personally reviewed and evaluated these images and lab results as part of my medical decision-making.   EKG Interpretation None      MDM   Pt with complaint of vaginal discharge that did not resolve after STD treatment last year. Request treatment for STD's, given the following in the ER: Medications  cefTRIAXone (ROCEPHIN) injection 250 mg (250 mg Intramuscular Given 10/18/15 0537)  azithromycin (ZITHROMAX) tablet 1,000 mg (1,000 mg Oral Given 10/18/15 0533)  metroNIDAZOLE (FLAGYL) tablet 2,000 mg (2,000 mg Oral Given 10/18/15 0530)   With home tx for vaginal yeast infection, two doses of diflucan, encouraged safe sex practices, encouraged to use OTC meds when appropriate, and follow up with the health department if she has further needs.  Return to ER precautions also reviewed.  Final diagnoses:  Vaginal discharge      Danelle BerryLeisa Brysten Reister, PA-C 11/09/15 2249  Shon Batonourtney F Horton, MD 11/10/15 1520

## 2015-10-18 NOTE — ED Notes (Signed)
Presents with vaginal discharge that is clear denies foul odor, reports she was treated for trichomonas at another facility and the discharge never stopped.

## 2016-03-07 ENCOUNTER — Encounter (HOSPITAL_COMMUNITY): Payer: Self-pay | Admitting: *Deleted

## 2016-03-07 ENCOUNTER — Inpatient Hospital Stay (HOSPITAL_COMMUNITY)
Admission: AD | Admit: 2016-03-07 | Discharge: 2016-03-07 | Disposition: A | Discharge status: Home / Self Care | Payer: Self-pay | Source: Ambulatory Visit | Attending: Obstetrics & Gynecology | Admitting: Obstetrics & Gynecology

## 2016-03-07 DIAGNOSIS — Z79899 Other long term (current) drug therapy: Secondary | ICD-10-CM | POA: Insufficient documentation

## 2016-03-07 DIAGNOSIS — F17210 Nicotine dependence, cigarettes, uncomplicated: Secondary | ICD-10-CM | POA: Insufficient documentation

## 2016-03-07 DIAGNOSIS — A499 Bacterial infection, unspecified: Secondary | ICD-10-CM

## 2016-03-07 DIAGNOSIS — N760 Acute vaginitis: Secondary | ICD-10-CM | POA: Insufficient documentation

## 2016-03-07 DIAGNOSIS — Z9889 Other specified postprocedural states: Secondary | ICD-10-CM | POA: Insufficient documentation

## 2016-03-07 DIAGNOSIS — B9689 Other specified bacterial agents as the cause of diseases classified elsewhere: Secondary | ICD-10-CM

## 2016-03-07 LAB — URINALYSIS, ROUTINE W REFLEX MICROSCOPIC
Bilirubin Urine: NEGATIVE
GLUCOSE, UA: NEGATIVE mg/dL
HGB URINE DIPSTICK: NEGATIVE
Ketones, ur: 15 mg/dL — AB
Leukocytes, UA: NEGATIVE
Nitrite: POSITIVE — AB
Protein, ur: NEGATIVE mg/dL
pH: 5.5 (ref 5.0–8.0)

## 2016-03-07 LAB — URINE MICROSCOPIC-ADD ON

## 2016-03-07 LAB — WET PREP, GENITAL
Sperm: NONE SEEN
TRICH WET PREP: NONE SEEN
Yeast Wet Prep HPF POC: NONE SEEN

## 2016-03-07 LAB — POCT PREGNANCY, URINE: PREG TEST UR: NEGATIVE

## 2016-03-07 MED ORDER — METRONIDAZOLE 500 MG PO TABS: 500.0000 mg | ORAL_TABLET | Freq: Two times a day (BID) | ORAL | 0 refills | Status: DC

## 2016-03-07 NOTE — MAU Provider Note (Signed)
[facility] MAU Attending Note  History     CSN: [id number]  Arrival date & time 03/07/16  1818   First Provider Initiated Contact with Patient 03/07/16 2101      Chief Complaint  Patient presents with  . Vaginal Discharge    Caitlin [name] is a 25 y.o. G1P1001 who presents to MAU today for evaluation of vaginal discharge x 3-4 days. Reports it did cause irritation. No vaginal bleeding. History of recurrent BV. Denies any fevers, chills, sweats, dysuria, nausea, vomiting, other GI or GU symptoms or other general symptoms.   Obstetric History   G1   P1   T1   P0   A0   L1    SAB0   TAB0   Ectopic0   Multiple0   Live Births0     # Outcome Date GA Lbr Len/2nd Weight Sex Delivery Anes PTL Lv  1 Term 07/07/07     CS-Unspec         Past Medical History:  Diagnosis Date  . Ovarian cyst     Past Surgical History:  Procedure Laterality Date  . CESAREAN SECTION  07/07/2007    No family history on file.  Social History  Substance Use Topics  . Smoking status: Current Every Day Smoker    Types: Cigarettes  . Smokeless tobacco: Not on file  . Alcohol use No    No Known Allergies  Prescriptions Prior to Admission  Medication Sig Dispense Refill Last Dose  . fluconazole (DIFLUCAN) 150 MG tablet Take one tablet (150 mg) by mouth once on 10/19/15, may repeat dose on 10/22/15 if symptoms have not improved. 2 tablet 0   . methocarbamol (ROBAXIN) 500 MG tablet Take 1 tablet (500 mg total) by mouth 2 (two) times daily. (Patient taking differently: Take 500 mg by mouth 2 (two) times daily as needed for muscle spasms. ) 20 tablet 0 Past Week at Unknown time     Physical Exam  BP 112/67 (BP Location: Right Arm)   Pulse 70   Temp 98.6 F (37 C) (Oral)   Resp 16   Wt 130 lb (59 kg)   LMP 02/15/2016   BMI 19.20 kg/m  GENERAL: Well-developed, well-nourished female in no acute distress  SKIN: Warm, dry and without erythema PSYCH: Normal mood and affect HEENT:  Normocephalic, atraumatic.   LUNGS: Normal respiratory effort, normal breath sounds HEART: Regular rate noted ABDOMEN: Soft, nondistended, nontender PELVIS: NEFG. Copious, malodorous, thin, white discharge seen. Pelvic culture samples obtained.  Normal cervix. Normal uterus and adnexa, no tenderness on bimanual exam. EXTREMITIES: No edema, no cyanosis, normal range of movement  MAU Course/MDM  Cultures done. UA positive for nitrites, urine culture sent  Labs and Imaging   Results for orders placed or performed during the hospital encounter of 03/07/16 (from the past 24 hour(s))  Urinalysis, Routine w reflex microscopic (not at [facility])     Status: Abnormal   Collection Time: 03/07/16  7:15 PM  Result Value Ref Range   Color, Urine YELLOW YELLOW   APPearance CLEAR CLEAR   Specific Gravity, Urine >1.030 (H) 1.005 - 1.030   pH 5.5 5.0 - 8.0   Glucose, UA NEGATIVE NEGATIVE mg/dL   Hgb urine dipstick NEGATIVE NEGATIVE   Bilirubin Urine NEGATIVE NEGATIVE   Ketones, ur 15 (A) NEGATIVE mg/dL   Protein, ur NEGATIVE NEGATIVE mg/dL   Nitrite POSITIVE (A) NEGATIVE   Leukocytes, UA NEGATIVE NEGATIVE  Urine microscopic-add on     Status:  Abnormal   Collection Time: 03/07/16  7:15 PM  Result Value Ref Range   Squamous Epithelial / LPF 0-5 (A) NONE SEEN   WBC, UA 0-5 0 - 5 WBC/hpf   RBC / HPF 0-5 0 - 5 RBC/hpf   Bacteria, UA MANY (A) NONE SEEN  Pregnancy, urine POC     Status: None   Collection Time: 03/07/16  7:28 PM  Result Value Ref Range   Preg Test, Ur NEGATIVE NEGATIVE  Wet prep, genital     Status: Abnormal   Collection Time: 03/07/16  9:10 PM  Result Value Ref Range   Yeast Wet Prep HPF POC NONE SEEN NONE SEEN   Trich, Wet Prep NONE SEEN NONE SEEN   Clue Cells Wet Prep HPF POC PRESENT (A) NONE SEEN   WBC, Wet Prep HPF POC FEW (A) NONE SEEN   Sperm NONE SEEN     Assessment and Plan   1. BV (bacterial vaginosis)    Metronidazole prescribed for BV. Proper vulvar hygiene  emphasized: discussed avoidance of perfumed soaps, detergents, lotions and any type of douches; in addition to wearing cotton underwear and no underwear at night.  Also recommended cleaning front to back, voiding and cleaning up after intercourse.  Told to follow up in outpatient office to discuss management of recurrent BV, may need prolonged antibiotic therapy vs boric acid capsule treatment.  Recommended Lactobacillus ingestion via yogurt or pills.  Was told to return to MAU for any pain, bleeding or other concerns, or if her condition were to change or worsen. Discharged to home in stable condition      Medication List    STOP taking these medications   fluconazole 150 MG tablet Commonly known as:  DIFLUCAN   methocarbamol 500 MG tablet Commonly known as:  ROBAXIN     TAKE these medications   metroNIDAZOLE 500 MG tablet Commonly known as:  FLAGYL Take 1 tablet (500 mg total) by mouth 2 (two) times daily.        Jaynie CollinsUGONNA  Punam Broussard, MD, FACOG Attending Obstetrician & Gynecologist, [facility], [facility]

## 2016-03-07 NOTE — MAU Note (Addendum)
Pt reports a discharge for "years". [facility] Pt was given medicine for it about 2 months ago, but no change in the discharge w/ meds. Pt states that she is trying to get pregnant, but has not been able to get pregnant. Pt is concerned that it may be related to this discharge that she is having.

## 2016-03-07 NOTE — MAU Note (Addendum)
Been checked at the hosp for the last 3 times she has been checked, for d/c. Was given a whole lot of medicine, but nothing has changed.  Still having a d/c. (explained to pt how d/c can be normal, esp when there is no odor, itching or pain)

## 2016-03-07 NOTE — Discharge Instructions (Signed)
Bacterial Vaginosis °Bacterial vaginosis is a vaginal infection that occurs when the normal balance of bacteria in the vagina is disrupted. It results from an overgrowth of certain bacteria. This is the most common vaginal infection in women of childbearing age. Treatment is important to prevent complications, especially in pregnant women, as it can cause a premature delivery. °CAUSES  °Bacterial vaginosis is caused by an increase in harmful bacteria that are normally present in smaller amounts in the vagina. Several different kinds of bacteria can cause bacterial vaginosis. However, the reason that the condition develops is not fully understood. °RISK FACTORS °Certain activities or behaviors can put you at an increased risk of developing bacterial vaginosis, including: °· Having a new sex partner or multiple sex partners. °· Douching. °· Using an intrauterine device (IUD) for contraception. °Women do not get bacterial vaginosis from toilet seats, bedding, swimming pools, or contact with objects around them. °SIGNS AND SYMPTOMS  °Some women with bacterial vaginosis have no signs or symptoms. Common symptoms include: °· Grey vaginal discharge. °· A fishlike odor with discharge, especially after sexual intercourse. °· Itching or burning of the vagina and vulva. °· Burning or pain with urination. °DIAGNOSIS  °Your health care provider will take a medical history and examine the vagina for signs of bacterial vaginosis. A sample of vaginal fluid may be taken. Your health care provider will look at this sample under a microscope to check for bacteria and abnormal cells. A vaginal pH test may also be done.  °TREATMENT  °Bacterial vaginosis may be treated with antibiotic medicines. These may be given in the form of a pill or a vaginal cream. A second round of antibiotics may be prescribed if the condition comes back after treatment. Because bacterial vaginosis increases your risk for sexually transmitted diseases, getting  treated can help reduce your risk for chlamydia, gonorrhea, HIV, and herpes. °HOME CARE INSTRUCTIONS  °· Only take over-the-counter or prescription medicines as directed by your health care provider. °· If antibiotic medicine was prescribed, take it as directed. Make sure you finish it even if you start to feel better. °· Tell all sexual partners that you have a vaginal infection. They should see their health care provider and be treated if they have problems, such as a mild rash or itching. °· During treatment, it is important that you follow these instructions: °¨ Avoid sexual activity or use condoms correctly. °¨ Do not douche. °¨ Avoid alcohol as directed by your health care provider. °¨ Avoid breastfeeding as directed by your health care provider. °SEEK MEDICAL CARE IF:  °· Your symptoms are not improving after 3 days of treatment. °· You have increased discharge or pain. °· You have a fever. °MAKE SURE YOU:  °· Understand these instructions. °· Will watch your condition. °· Will get help right away if you are not doing well or get worse. °FOR MORE INFORMATION  °Centers for Disease Control and Prevention, Division of STD Prevention: www.cdc.gov/std °American Sexual Health Association (ASHA): www.ashastd.org  °  °This information is not intended to replace advice given to you by your health care provider. Make sure you discuss any questions you have with your health care provider. °  °Document Released: 06/04/2005 Document Revised: 06/25/2014 Document Reviewed: 01/14/2013 °Elsevier Interactive Patient Education ©2016 Elsevier Inc. ° ° ° °Vaginitis °Vaginitis is an inflammation of the vagina. It is most often caused by a change in the normal balance of the bacteria and yeast that live in the vagina. This change in balance   causes an overgrowth of certain bacteria or yeast, which causes the inflammation. There are different types of vaginitis, but the most common types are: °· Bacterial vaginosis. °· Yeast  infection (candidiasis). °· Trichomoniasis vaginitis. This is a sexually transmitted infection (STI). °· Viral vaginitis. °· Atrophic vaginitis. °· Allergic vaginitis. °CAUSES  °The cause depends on the type of vaginitis. Vaginitis can be caused by: °· Bacteria (bacterial vaginosis). °· Yeast (yeast infection). °· A parasite (trichomoniasis vaginitis) °· A virus (viral vaginitis). °· Low hormone levels (atrophic vaginitis). Low hormone levels can occur during pregnancy, breastfeeding, or after menopause. °· Irritants, such as bubble baths, scented tampons, and feminine sprays (allergic vaginitis). °Other factors can change the normal balance of the yeast and bacteria that live in the vagina. These include: °· Antibiotic medicines. °· Poor hygiene. °· Diaphragms, vaginal sponges, spermicides, birth control pills, and intrauterine devices (IUD). °· Sexual intercourse. °· Infection. °· Uncontrolled diabetes. °· A weakened immune system. °SYMPTOMS  °Symptoms can vary depending on the cause of the vaginitis. Common symptoms include: °· Abnormal vaginal discharge. °¨ The discharge is white, gray, or yellow with bacterial vaginosis. °¨ The discharge is thick, white, and cheesy with a yeast infection. °¨ The discharge is frothy and yellow or greenish with trichomoniasis. °· A bad vaginal odor. °¨ The odor is fishy with bacterial vaginosis. °· Vaginal itching, pain, or swelling. °· Painful intercourse. °· Pain or burning when urinating. °Sometimes, there are no symptoms. °TREATMENT  °Treatment will vary depending on the type of infection.  °· Bacterial vaginosis and trichomoniasis are often treated with antibiotic creams or pills. °· Yeast infections are often treated with antifungal medicines, such as vaginal creams or suppositories. °· Viral vaginitis has no cure, but symptoms can be treated with medicines that relieve discomfort. Your sexual partner should be treated as well. °· Atrophic vaginitis may be treated with an  estrogen cream, pill, suppository, or vaginal ring. If vaginal dryness occurs, lubricants and moisturizing creams may help. You may be told to avoid scented soaps, sprays, or douches. °· Allergic vaginitis treatment involves quitting the use of the product that is causing the problem. Vaginal creams can be used to treat the symptoms. °HOME CARE INSTRUCTIONS  °· Take all medicines as directed by your caregiver. °· Keep your genital area clean and dry. Avoid soap and only rinse the area with water. °· Avoid douching. It can remove the healthy bacteria in the vagina. °· Do not use tampons or have sexual intercourse until your vaginitis has been treated. Use sanitary pads while you have vaginitis. °· Wipe from front to back. This avoids the spread of bacteria from the rectum to the vagina. °· Let air reach your genital area. °¨ Wear cotton underwear to decrease moisture buildup. °¨ Avoid wearing underwear while you sleep until your vaginitis is gone. °¨ Avoid tight pants and underwear or nylons without a cotton panel. °¨ Take off wet clothing (especially bathing suits) as soon as possible. °· Use mild, non-scented products. Avoid using irritants, such as: °¨ Scented feminine sprays. °¨ Fabric softeners. °¨ Scented detergents. °¨ Scented tampons. °¨ Scented soaps or bubble baths. °· Practice safe sex and use condoms. Condoms may prevent the spread of trichomoniasis and viral vaginitis. °SEEK MEDICAL CARE IF:  °· You have abdominal pain. °· You have a fever or persistent symptoms for more than 2-3 days. °· You have a fever and your symptoms suddenly get worse. °  °This information is not intended to replace advice given   to you by your health care provider. Make sure you discuss any questions you have with your health care provider. °  °Document Released: 04/01/2007 Document Revised: 10/19/2014 Document Reviewed: 11/15/2011 °Elsevier Interactive Patient Education ©2016 Elsevier Inc. ° °

## 2016-03-08 LAB — GC/CHLAMYDIA PROBE AMP (~~LOC~~) NOT AT ARMC
CHLAMYDIA, DNA PROBE: NEGATIVE
NEISSERIA GONORRHEA: NEGATIVE

## 2016-03-10 LAB — URINE CULTURE: SPECIAL REQUESTS: NORMAL

## 2016-03-11 ENCOUNTER — Telehealth: Payer: Self-pay | Admitting: Nurse Practitioner

## 2016-03-11 ENCOUNTER — Encounter: Payer: Self-pay | Admitting: Nurse Practitioner

## 2016-03-11 MED ORDER — CIPROFLOXACIN HCL 500 MG PO TABS: 500.0000 mg | ORAL_TABLET | Freq: Two times a day (BID) | ORAL | 0 refills | Status: DC

## 2016-03-11 NOTE — Telephone Encounter (Signed)
Client called in and was given info about her UTI and [facility]  Client reports she will get her medication and take it.  Advised to seek additional care if she develops fever, body aches or back pain.  Nolene Bernheimerri Burleson, NP

## 2016-03-11 NOTE — Telephone Encounter (Addendum)
Urine Culture reviewed.  Client is not pregnant and has >100,000 col of E. Coli.  Is susceptible to Cipro so will send Rx to her pharmacy.  Attempted to call patient  - no answer and not albe to leave a message.  Will attempt to call again later.  Called again at [phone] - left message on client's mobile phone to call us back.

## 2016-03-14 ENCOUNTER — Other Ambulatory Visit: Payer: Self-pay | Admitting: Obstetrics & Gynecology

## 2016-04-16 ENCOUNTER — Encounter (HOSPITAL_COMMUNITY): Payer: Self-pay | Admitting: *Deleted

## 2016-04-16 ENCOUNTER — Emergency Department (HOSPITAL_COMMUNITY)
Admission: EM | Admit: 2016-04-16 | Discharge: 2016-04-16 | Disposition: A | Discharge status: Home / Self Care | Payer: Self-pay | Attending: Emergency Medicine | Admitting: Emergency Medicine

## 2016-04-16 ENCOUNTER — Emergency Department (HOSPITAL_COMMUNITY): Payer: Self-pay

## 2016-04-16 DIAGNOSIS — J069 Acute upper respiratory infection, unspecified: Secondary | ICD-10-CM | POA: Insufficient documentation

## 2016-04-16 DIAGNOSIS — B9789 Other viral agents as the cause of diseases classified elsewhere: Secondary | ICD-10-CM

## 2016-04-16 DIAGNOSIS — F17210 Nicotine dependence, cigarettes, uncomplicated: Secondary | ICD-10-CM | POA: Insufficient documentation

## 2016-04-16 MED ORDER — BENZONATATE 100 MG PO CAPS: 100.0000 mg | ORAL_CAPSULE | Freq: Three times a day (TID) | ORAL | 0 refills | Status: DC

## 2016-04-16 NOTE — ED Notes (Signed)
Patient verbalizes understanding of medications and home instructions

## 2016-04-16 NOTE — Discharge Instructions (Signed)
Take tessalon for cough. You can take over the counter medications in addition for cough relief. Drink plenty of fluids. Salt water gargles. Tylenol/motrin for fever, body aches, headache. Follow up with a family doctor if not improving.

## 2016-04-16 NOTE — ED Notes (Signed)
EDP at bedside  

## 2016-04-16 NOTE — ED Notes (Signed)
Taken to imaging.

## 2016-04-16 NOTE — ED Provider Notes (Signed)
[facility]-EMERGENCY DEPT Provider Note   CSN: [id number] Arrival date & time: 04/16/16  0806     History   Chief Complaint Chief Complaint  Patient presents with  . Cough  . Emesis    HPI Caitlin [name] is a 25 y.o. female.  HPI Caitlin [name] is a 25 y.o. female presents to emergency department complaining of cough and congestion.  Patient reports symptoms have been going on for 4 days.  Reports several episodes of posttussive emesis.  Reports right eye itching and drainage.  Denies fever chills.  Denies any abdominal pain, urinary symptoms, vaginal discharge or bleeding.  Patient reports chest pain with coughing.  Reports cough is productive with thick green sputum. No treatment prior to coming in. Nothing making her symptoms better or worse. States "my dad made me come here to get checked out."   Past Medical History:  Diagnosis Date  . Ovarian cyst     Patient Active Problem List   Diagnosis Date Noted  . Chronic pelvic pain in female 02/11/2013    Past Surgical History:  Procedure Laterality Date  . CESAREAN SECTION  07/07/2007    OB History    Gravida Para Term Preterm AB Living   1 1 1     1    SAB TAB Ectopic Multiple Live Births                   Home Medications    Prior to Admission medications   Medication Sig Start Date End Date Taking? Authorizing Provider  ciprofloxacin (CIPRO) 500 MG tablet Take 1 tablet (500 mg total) by mouth 2 (two) times daily. 03/11/16   Currie Pariserri L Burleson, NP  metroNIDAZOLE (FLAGYL) 500 MG tablet Take 1 tablet (500 mg total) by mouth 2 (two) times daily. 03/07/16   Tereso NewcomerUgonna A Anyanwu, MD    Family History No family history on file.  Social History Social History  Substance Use Topics  . Smoking status: Current Every Day Smoker    Packs/day: 2.00    Types: Cigarettes  . Smokeless tobacco: Never Used  . Alcohol use No     Allergies   Review of patient's allergies indicates no known allergies.   Review of Systems Review of  Systems  Constitutional: Negative for chills and fever.  Respiratory: Positive for cough and chest tightness. Negative for shortness of breath.   Cardiovascular: Positive for chest pain. Negative for palpitations and leg swelling.  Gastrointestinal: Positive for vomiting. Negative for abdominal pain, diarrhea and nausea.  Genitourinary: Negative for dysuria, flank pain, pelvic pain, vaginal bleeding, vaginal discharge and vaginal pain.  Musculoskeletal: Negative for arthralgias, myalgias, neck pain and neck stiffness.  Skin: Negative for rash.  Neurological: Negative for dizziness, weakness and headaches.  All other systems reviewed and are negative.    Physical Exam Updated Vital Signs BP 121/91   Pulse 84   Temp 98.3 F (36.8 C) (Oral)   Resp 16   Ht 5\' 9"  (1.753 m)   Wt 59 kg   LMP 04/15/2016   SpO2 99%   BMI 19.20 kg/m   Physical Exam  Constitutional: She is oriented to person, place, and time. She appears well-developed and well-nourished. No distress.  HENT:  Head: Normocephalic.  Right Ear: External ear normal.  Left Ear: External ear normal.  Nose: Nose normal.  Mouth/Throat: Oropharynx is clear and moist.  Eyes: Conjunctivae are normal.  Neck: Normal range of motion. Neck supple.  Cardiovascular: Normal rate, regular rhythm and normal  heart sounds.   Pulmonary/Chest: Effort normal and breath sounds normal. No respiratory distress. She has no wheezes. She has no rales.  Abdominal: Soft. Bowel sounds are normal. She exhibits no distension. There is tenderness. There is no rebound.  Musculoskeletal: She exhibits no edema.  Neurological: She is alert and oriented to person, place, and time.  Skin: Skin is warm and dry.  Psychiatric: She has a normal mood and affect. Her behavior is normal.  Nursing note and vitals reviewed.    ED Treatments / Results  Labs (all labs ordered are listed, but only abnormal results are displayed) Labs Reviewed - No data to  display  EKG  EKG Interpretation None       Radiology Dg Chest 2 View  Result Date: 04/16/2016 CLINICAL DATA:  Productive cough, chest pain. EXAM: CHEST  2 VIEW COMPARISON:  08/01/2014 FINDINGS: The heart size and mediastinal contours are within normal limits. There is no evidence of pulmonary edema, consolidation, pneumothorax, nodule or pleural fluid. The visualized skeletal structures are unremarkable. IMPRESSION: No active cardiopulmonary disease. Electronically Signed   By: Irish LackGlenn  Yamagata M.D.   On: 04/16/2016 09:40    Procedures Procedures (including critical care time)  Medications Ordered in ED Medications - No data to display   Initial Impression / Assessment and Plan / ED Course  I have reviewed the triage vital signs and the nursing notes.  Pertinent labs & imaging results that were available during my care of the patient were reviewed by me and considered in my medical decision making (see chart for details).  Clinical Course   Patient emergency department with cough, congestion, sore throat, posttussive emesis.  Nontoxic appearing, hoarse voice.  Lungs are clear.  Chest x-ray obtained to rule out pneumonia and is negative.  Most likely viral URI with cough.  Advised symptomatic treatment, over-the-counter Tylenol/Motrin, cough medications,  fluids, follow up as needed.  Final Clinical Impressions(s) / ED Diagnoses   Final diagnoses:  Viral URI with cough    New Prescriptions New Prescriptions   BENZONATATE (TESSALON) 100 MG CAPSULE    Take 1 capsule (100 mg total) by mouth every 8 (eight) hours.     Jaynie Crumbleatyana Johnson [place], PA-C 04/16/16 1006    Jacalyn LefevreJulie Haviland, MD 04/16/16 1044

## 2016-04-16 NOTE — ED Triage Notes (Signed)
Pt states cough with thick green sputum, chest pain with coughing, emesis of "thick green stuff" and R eye itching and drainage x 4 days.

## 2016-08-06 ENCOUNTER — Emergency Department (HOSPITAL_BASED_OUTPATIENT_CLINIC_OR_DEPARTMENT_OTHER): Payer: Self-pay

## 2016-08-06 ENCOUNTER — Emergency Department (HOSPITAL_BASED_OUTPATIENT_CLINIC_OR_DEPARTMENT_OTHER)
Admission: EM | Admit: 2016-08-06 | Discharge: 2016-08-06 | Disposition: A | Discharge status: Home / Self Care | Payer: Self-pay | Attending: Emergency Medicine | Admitting: Emergency Medicine

## 2016-08-06 ENCOUNTER — Encounter (HOSPITAL_BASED_OUTPATIENT_CLINIC_OR_DEPARTMENT_OTHER): Payer: Self-pay | Admitting: Emergency Medicine

## 2016-08-06 DIAGNOSIS — M545 Low back pain: Secondary | ICD-10-CM | POA: Insufficient documentation

## 2016-08-06 DIAGNOSIS — M25552 Pain in left hip: Secondary | ICD-10-CM | POA: Insufficient documentation

## 2016-08-06 DIAGNOSIS — Y939 Activity, unspecified: Secondary | ICD-10-CM | POA: Insufficient documentation

## 2016-08-06 DIAGNOSIS — F17210 Nicotine dependence, cigarettes, uncomplicated: Secondary | ICD-10-CM | POA: Insufficient documentation

## 2016-08-06 DIAGNOSIS — R1032 Left lower quadrant pain: Secondary | ICD-10-CM | POA: Insufficient documentation

## 2016-08-06 DIAGNOSIS — Y929 Unspecified place or not applicable: Secondary | ICD-10-CM | POA: Insufficient documentation

## 2016-08-06 DIAGNOSIS — W19XXXA Unspecified fall, initial encounter: Secondary | ICD-10-CM

## 2016-08-06 DIAGNOSIS — W010XXA Fall on same level from slipping, tripping and stumbling without subsequent striking against object, initial encounter: Secondary | ICD-10-CM | POA: Insufficient documentation

## 2016-08-06 DIAGNOSIS — Y999 Unspecified external cause status: Secondary | ICD-10-CM | POA: Insufficient documentation

## 2016-08-06 MED ORDER — KETOROLAC TROMETHAMINE 60 MG/2ML IM SOLN
60.0000 mg | Freq: Once | INTRAMUSCULAR | Status: AC
  Administered 2016-08-06: 60 mg via INTRAMUSCULAR
  Filled 2016-08-06: qty 2

## 2016-08-06 MED ORDER — MELOXICAM 15 MG PO TABS: 15.0000 mg | ORAL_TABLET | Freq: Every day | ORAL | 0 refills | Status: DC

## 2016-08-06 MED ORDER — BACLOFEN 10 MG PO TABS
10.0000 mg | ORAL_TABLET | Freq: Three times a day (TID) | ORAL | 0 refills | Status: DC
Start: 2016-08-06 — End: 2018-07-07

## 2016-08-06 NOTE — ED Notes (Signed)
Pt declined pre-imaging pregnancy test. States "I've been gay for 6 years and I'm on my period". Radiology made aware

## 2016-08-06 NOTE — ED Triage Notes (Addendum)
Patient states that she fell in the shower. Denies any LOC. The patient reports that her left hip and pelvis hurts at this time The patient is unable to sit on that side. Patient seem upset during triage. PAtient reports that she is also have an allergic reaction where she is having bumps all over her upper chest and neck. Patient when moving attempts not to sit on the left side and loosed her balance. 2 times that patient also fell but caught herself in triage. When she did these 2 episodes she cried out in pain and started to be tearful. The patient was offered ice for her sore areas
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
## 2016-08-06 NOTE — ED Notes (Signed)
Patient states that she is in too much pain to use the crutches ordered for her. When instructed on how to use them  patient stated that "it hurts too much" when attempting ambulation on the crutches.

## 2016-08-06 NOTE — Discharge Instructions (Signed)
Apply ice to the affected area. Take the medication I have prescribed. Return for any Weakness or numbness.

## 2016-08-06 NOTE — ED Notes (Signed)
Pt given specimen cup and made aware of need for urine sample

## 2016-08-13 ENCOUNTER — Emergency Department (HOSPITAL_BASED_OUTPATIENT_CLINIC_OR_DEPARTMENT_OTHER)
Admission: EM | Admit: 2016-08-13 | Discharge: 2016-08-13 | Disposition: A | Discharge status: Home / Self Care | Payer: Self-pay | Attending: Emergency Medicine | Admitting: Emergency Medicine

## 2016-08-13 ENCOUNTER — Emergency Department (HOSPITAL_BASED_OUTPATIENT_CLINIC_OR_DEPARTMENT_OTHER): Payer: Self-pay

## 2016-08-13 ENCOUNTER — Encounter (HOSPITAL_BASED_OUTPATIENT_CLINIC_OR_DEPARTMENT_OTHER): Payer: Self-pay | Admitting: *Deleted

## 2016-08-13 DIAGNOSIS — F17210 Nicotine dependence, cigarettes, uncomplicated: Secondary | ICD-10-CM | POA: Insufficient documentation

## 2016-08-13 DIAGNOSIS — Y999 Unspecified external cause status: Secondary | ICD-10-CM | POA: Insufficient documentation

## 2016-08-13 DIAGNOSIS — Y9389 Activity, other specified: Secondary | ICD-10-CM | POA: Insufficient documentation

## 2016-08-13 DIAGNOSIS — S301XXA Contusion of abdominal wall, initial encounter: Secondary | ICD-10-CM

## 2016-08-13 DIAGNOSIS — W182XXA Fall in (into) shower or empty bathtub, initial encounter: Secondary | ICD-10-CM | POA: Insufficient documentation

## 2016-08-13 DIAGNOSIS — Y929 Unspecified place or not applicable: Secondary | ICD-10-CM | POA: Insufficient documentation

## 2016-08-13 DIAGNOSIS — Z79899 Other long term (current) drug therapy: Secondary | ICD-10-CM | POA: Insufficient documentation

## 2016-08-13 NOTE — ED Triage Notes (Signed)
States she was seen last week after falling in the shower. She has a knot on her left lateral abdominal quadrant. Soreness. Worse with movement.

## 2016-08-13 NOTE — ED Notes (Signed)
ED Provider at bedside. 

## 2016-08-13 NOTE — Discharge Instructions (Signed)
Continue current medications. Apply heating pad. Follow with primary care doctor. Return if worsening symptoms, if vomiting, blood in stool, any new concerning symptoms.

## 2016-08-13 NOTE — ED Provider Notes (Signed)
[facility] Provider Note   CSN: [id number] Arrival date & time: 08/13/16  1703  By signing my name below, I, Alyssa Grove, attest that this documentation has been prepared under the direction and in the presence of Riggs Dineen, PA-C. Electronically Signed: Alyssa Grove, ED Scribe. 08/13/16. 6:18 PM.  History   Chief Complaint Chief Complaint  Patient presents with  . Abdominal Pain   The history is provided by the patient. No language interpreter was used.   HPI Comments: Caitlin [name] is a 26 y.o. female who presents to the Emergency Department complaining of gradual onset and worsening, constant, moderate, lower left sided abdominal pain s/p fall 1 week ago. Pt states she was in the shower when she slipped and fell, striking her left side on the edge of the shower. She was seen last week for this same complaint, but states pain has not improved. Pt has tried resting and prescribed medications with no significant relief to pain. Pt reports associated knot to that area. Pt denies chance of pregnancy. She denies vomiting, fever, or any other complaints.  Past Medical History:  Diagnosis Date  . Ovarian cyst     Patient Active Problem List   Diagnosis Date Noted  . Chronic pelvic pain in female 02/11/2013    Past Surgical History:  Procedure Laterality Date  . CESAREAN SECTION  07/07/2007    OB History    Gravida Para Term Preterm AB Living   1 1 1     1    SAB TAB Ectopic Multiple Live Births                   Home Medications    Prior to Admission medications   Medication Sig Start Date End Date Taking? Authorizing Provider  baclofen (LIORESAL) 10 MG tablet Take 1 tablet (10 mg total) by mouth 3 (three) times daily. 08/06/16  Yes Arthor Captain, PA-C  meloxicam (MOBIC) 15 MG tablet Take 1 tablet (15 mg total) by mouth daily. Take 1 daily with food. 08/06/16  Yes Arthor Captain, PA-C  benzonatate (TESSALON) 100 MG capsule Take 1 capsule (100 mg total)  by mouth every 8 (eight) hours. 04/16/16   Jamorion Gomillion, PA-C  ciprofloxacin (CIPRO) 500 MG tablet Take 1 tablet (500 mg total) by mouth 2 (two) times daily. 03/11/16   Currie Paris, NP  metroNIDAZOLE (FLAGYL) 500 MG tablet Take 1 tablet (500 mg total) by mouth 2 (two) times daily. 03/07/16   Tereso Newcomer, MD    Family History No family history on file.  Social History Social History  Substance Use Topics  . Smoking status: Current Every Day Smoker    Packs/day: 2.00    Types: Cigarettes  . Smokeless tobacco: Never Used  . Alcohol use No     Allergies   Patient has no known allergies.   Review of Systems Review of Systems  Constitutional: Negative for fever.  Gastrointestinal: Positive for abdominal pain. Negative for vomiting.  Musculoskeletal: Positive for arthralgias.  Neurological: Negative for weakness and numbness.  All other systems reviewed and are negative.  Physical Exam Updated Vital Signs BP 130/93 (BP Location: Left Arm)   Pulse 90   Temp 98.7 F (37.1 C) (Oral)   Resp 18   Ht 5\' 9"  (1.753 m)   Wt 130 lb (59 kg)   LMP 08/06/2016   SpO2 100%   BMI 19.20 kg/m   Physical Exam  Constitutional: She is oriented to person, place, and  time. She appears well-developed and well-nourished. She is active. No distress.  HENT:  Head: Normocephalic and atraumatic.  Eyes: Conjunctivae are normal.  Cardiovascular: Normal rate, regular rhythm and normal heart sounds.   Pulmonary/Chest: Effort normal and breath sounds normal. No respiratory distress. She has no wheezes. She has no rales.  Abdominal: Soft. She exhibits no distension. There is tenderness. There is no guarding.  Tenderness to palpation over left iliac crest and just proximal to it. He feels that there could be a hematoma and her rectus muscle. Pain with abdominal wall flexion, with weightbearing on the left hip. Pain with left leg flexion at the hip.  Musculoskeletal: Normal range of motion.    Neurological: She is alert and oriented to person, place, and time.  Skin: Skin is warm and dry.  Psychiatric: She has a normal mood and affect. Her behavior is normal.  Nursing note and vitals reviewed.   ED Treatments / Results  DIAGNOSTIC STUDIES: Oxygen Saturation is 100% on RA, normal by my interpretation.    COORDINATION OF CARE: 5:54 PM Discussed treatment plan with pt at bedside which includes DG Pelvis and pt agreed to plan.  Labs (all labs ordered are listed, but only abnormal results are displayed) Labs Reviewed - No data to display  EKG  EKG Interpretation None       Radiology Dg Pelvis 1-2 Views  Result Date: 08/13/2016 CLINICAL DATA:  Left iliac crest injury. Fall in the shower 08/06/2016 injuring left pelvis. Persistent anterior pelvic pain near iliac crest. EXAM: PELVIS - 1-2 VIEW COMPARISON:  Pelvis and left hip radiographs 08/06/2016 FINDINGS: There is no evidence of pelvic fracture or diastasis. Pubic symphysis and sacroiliac joints are congruent. Cortical margins are intact with particular attention to the left iliac crest. No pelvic bone lesions are seen. Both femoral heads are seated in the respective acetabula. IMPRESSION: Negative radiograph of the pelvis.  No evidence of fracture. Electronically Signed   By: Rubye Oaks M.D.   On: 08/13/2016 18:22    Procedures Procedures (including critical care time)  Medications Ordered in ED Medications - No data to display   Initial Impression / Assessment and Plan / ED Course  I have reviewed the triage vital signs and the nursing notes.  Pertinent labs & imaging results that were available during my care of the patient were reviewed by me and considered in my medical decision making (see chart for details).    The patient emergency department after an injury to her left abdominal wall 7 days ago. Her vital signs today are normal. She had a left hip x-ray which was negative at time of the incident. She  continues to have pain over the left iliac crest and around the soft tissue of the abdomen. Pain is worsened with movement and coughing. On exam, it appears she may have an abdominal wall hematoma in her abdominal muscle. There is no tenderness with deep palpation of the surrounding abdomen. Nothing she has a perforation of her intestines or any other intra-abdominal organs. She has been eating and drinking well. She has had normal bowel movements. We'll continue on her regular medications, she takes an NSAID and baclofen. Advised to do some heating pads, rest, follow-up with family doctor as needed. I did do a pelvic x-ray to rule out iliac crest fracture and it is negative.  Vitals:   08/13/16 1707  BP: 130/93  Pulse: 90  Resp: 18  Temp: 98.7 F (37.1 C)  TempSrc: Oral  SpO2: 100%  Weight: 59 kg  Height: 5\' 9"  (1.753 m)    I personally performed the services described in this documentation, which was scribed in my presence. The recorded information has been reviewed and is accurate.   Final Clinical Impressions(s) / ED Diagnoses   Final diagnoses:  Abdominal wall hematoma, initial encounter    New Prescriptions New Prescriptions   No medications on file     Jaynie Crumbleatyana Ralph Brouwer, PA-C 08/13/16 1901    Marily MemosJason Mesner, MD 08/14/16 1240

## 2017-08-08 ENCOUNTER — Other Ambulatory Visit: Payer: Self-pay

## 2017-08-08 ENCOUNTER — Emergency Department: Payer: Managed Care, Other (non HMO)

## 2017-08-08 ENCOUNTER — Emergency Department
Admission: EM | Admit: 2017-08-08 | Discharge: 2017-08-08 | Disposition: A | Discharge status: Home / Self Care | Payer: Managed Care, Other (non HMO) | Attending: Student in an Organized Health Care Education/Training Program | Admitting: Student in an Organized Health Care Education/Training Program

## 2017-08-08 ENCOUNTER — Encounter: Payer: Self-pay | Admitting: *Deleted

## 2017-08-08 DIAGNOSIS — Z79899 Other long term (current) drug therapy: Secondary | ICD-10-CM | POA: Insufficient documentation

## 2017-08-08 DIAGNOSIS — N644 Mastodynia: Secondary | ICD-10-CM

## 2017-08-08 DIAGNOSIS — F17210 Nicotine dependence, cigarettes, uncomplicated: Secondary | ICD-10-CM | POA: Insufficient documentation

## 2017-08-08 LAB — POCT PREGNANCY, URINE: PREG TEST UR: NEGATIVE

## 2017-08-08 MED ORDER — HYDROCODONE-ACETAMINOPHEN 5-325 MG PO TABS: 1.0000 | ORAL_TABLET | ORAL | 0 refills | Status: DC | PRN

## 2017-08-08 MED ORDER — CYCLOBENZAPRINE HCL 5 MG PO TABS: 5.0000 mg | ORAL_TABLET | Freq: Three times a day (TID) | ORAL | 0 refills | Status: DC | PRN

## 2017-08-08 NOTE — ED Provider Notes (Signed)
[facility] Emergency Department Provider Note    First MD Initiated Contact with Patient 08/08/17 Caitlin [name]     (approximate)  I have reviewed the triage vital signs and the nursing notes.   HISTORY  Chief Complaint Breast Pain    HPI Caitlin [name] is a 27 y.o. female with a history of chronic pelvic pain presents with left breast pain for the past several days.  No fevers.  There is worsening pain with movement and palpation of the breast.  Is not noticed any discharge or bleeding.  No rashes.  No shortness of breath.  No cough or fevers.  She smokes occasionally.  No family history of breast cancer or malignancy.  Past Medical History:  Diagnosis Date  . Ovarian cyst    History reviewed. No pertinent family history. Past Surgical History:  Procedure Laterality Date  . CESAREAN SECTION  07/07/2007   Patient Active Problem List   Diagnosis Date Noted  . Chronic pelvic pain in female 02/11/2013      Prior to Admission medications   Medication Sig Start Date End Date Taking? Authorizing Provider  baclofen (LIORESAL) 10 MG tablet Take 1 tablet (10 mg total) by mouth 3 (three) times daily. 08/06/16   Harris, Abigail, PA-C  benzonatate (TESSALON) 100 MG capsule Take 1 capsule (100 mg total) by mouth every 8 (eight) hours. 04/16/16   Kirichenko, Lemont Fillers, PA-C  ciprofloxacin (CIPRO) 500 MG tablet Take 1 tablet (500 mg total) by mouth 2 (two) times daily. 03/11/16   Burleson, Brand Males, NP  meloxicam (MOBIC) 15 MG tablet Take 1 tablet (15 mg total) by mouth daily. Take 1 daily with food. 08/06/16   Arthor Captain, PA-C  metroNIDAZOLE (FLAGYL) 500 MG tablet Take 1 tablet (500 mg total) by mouth 2 (two) times daily. 03/07/16   Tereso Newcomer, MD    Allergies Patient has no known allergies.    Social History Social History   Tobacco Use  . Smoking status: Current Every Day Smoker    Packs/day: 2.00    Types: Cigarettes  . Smokeless tobacco: Never Used    Substance Use Topics  . Alcohol use: No  . Drug use: No    Review of Systems Patient denies headaches, rhinorrhea, blurry vision, numbness, shortness of breath, chest pain, edema, cough, abdominal pain, nausea, vomiting, diarrhea, dysuria, fevers, rashes or hallucinations unless otherwise stated above in HPI. ____________________________________________   PHYSICAL EXAM:  VITAL SIGNS: Vitals:   08/08/17 1715  BP: 117/81  Pulse: 99  Resp: 16  Temp: 98.8 F (37.1 C)  SpO2: 100%    Constitutional: Alert and oriented. Well appearing and in no acute distress. Eyes: Conjunctivae are normal.  Head: Atraumatic. Nose: No congestion/rhinnorhea. Mouth/Throat: Mucous membranes are moist.   Neck: No stridor. Painless ROM.  Cardiovascular: Normal rate, regular rhythm. Grossly normal heart sounds.  Good peripheral circulation. Respiratory: Normal respiratory effort.  No retractions. Lungs CTAB. Gastrointestinal: Soft and nontender. No distention. No abdominal bruits. No CVA tenderness. Genitourinary:  Musculoskeletal: Breast exam performed with chaperone shows evidence of tenderness of the left breast at 2:00 with some small fibrocystic changes but no significant fluctuant masses appreciated.  Does have a slightly engorged vein at 2:00 but this is nontender without any evidence of thrombophlebitis.  No lower extremity tenderness nor edema.  No joint effusions. Neurologic:  Normal speech and language. No gross focal neurologic deficits are appreciated. No facial droop Skin:  Skin is warm, dry and intact. No rash  noted. Psychiatric: Mood and affect are normal. Speech and behavior are normal.  ____________________________________________   LABS (all labs ordered are listed, but only abnormal results are displayed)  Results for orders placed or performed during the hospital encounter of 08/08/17 (from the past 24 hour(s))  Pregnancy, urine POC     Status: None   Collection Time: 08/08/17   5:44 PM  Result Value Ref Range   Preg Test, Ur NEGATIVE NEGATIVE   ____________________________________________  EKG My review and personal interpretation at Time: 17:21   Indication: chest pain  Rate: 95  Rhythm: sinus Axis: normal  Other: normal intervals, no stemi, wpw or brugada ____________________________________________  RADIOLOGY  I personally reviewed all radiographic images ordered to evaluate for the above acute complaints and reviewed radiology reports and findings.  These findings were personally discussed with the patient.  Please see medical record for radiology report.  ____________________________________________   PROCEDURES  Procedure(s) performed:  Procedures    Critical Care performed: no ____________________________________________   INITIAL IMPRESSION / ASSESSMENT AND PLAN / ED COURSE  Pertinent labs & imaging results that were available during my care of the patient were reviewed by me and considered in my medical decision making (see chart for details).  DDX: Mass, fibrocystic breast tissue, abscess, musculoskeletal strain, pneumothorax, pneumonia, pericarditis  Caitlin [name] is a 27 y.o. who presents to the ED with symptoms as described above.  Patient well-appearing and in no acute distress.  Physical exam as above.  No evidence of abscess.  Pain is very reproducible therefore do suspect some component of muscular skeletal pain.  EKG and chest x-ray are reassuring.  Is not clinically consistent with ACS, dissection, PE.  She is low risk by Wells and is PERC negative.  Abdominal exam is soft and benign.  At this point do believe that she is stable and appropriate for outpatient referral.  Have discussed with the patient and available family all diagnostics and treatments performed thus far and all questions were answered to the best of my ability. The patient demonstrates understanding and agreement with plan.        ____________________________________________   FINAL CLINICAL IMPRESSION(S) / ED DIAGNOSES  Final diagnoses:  Breast pain in female      NEW MEDICATIONS STARTED DURING THIS VISIT:  New Prescriptions   No medications on file     Note:  This document was prepared using Dragon voice recognition software and may include unintentional dictation errors.    Willy Eddyobinson, Tayron Hunnell, MD 08/08/17 Rosamaria [id number]

## 2017-08-08 NOTE — ED Triage Notes (Signed)
Pt presents w/ c/o L breast pain x 2 days. Pt states pain is reproducible w/ inspiration and change in position.

## 2017-08-08 NOTE — ED Notes (Signed)
Pt reports sharp pain in left breast. PT reports pain started yesterday and has been a constant pain.

## 2017-08-08 NOTE — ED Notes (Signed)
Urine sample collected for POC Preg. Sample sent to lab after POC done.

## 2017-08-08 NOTE — ED Notes (Signed)
Stabbing pain left breast, pain with inspiration per pt.

## 2017-11-11 ENCOUNTER — Emergency Department (HOSPITAL_COMMUNITY)
Admission: EM | Admit: 2017-11-11 | Discharge: 2017-11-11 | Disposition: A | Discharge status: Home / Self Care | Payer: Managed Care, Other (non HMO) | Attending: Emergency Medicine | Admitting: Emergency Medicine

## 2017-11-11 ENCOUNTER — Other Ambulatory Visit: Payer: Self-pay

## 2017-11-11 ENCOUNTER — Encounter (HOSPITAL_COMMUNITY): Payer: Self-pay

## 2017-11-11 DIAGNOSIS — F17210 Nicotine dependence, cigarettes, uncomplicated: Secondary | ICD-10-CM | POA: Insufficient documentation

## 2017-11-11 DIAGNOSIS — K1379 Other lesions of oral mucosa: Secondary | ICD-10-CM | POA: Diagnosis present

## 2017-11-11 DIAGNOSIS — Z79899 Other long term (current) drug therapy: Secondary | ICD-10-CM | POA: Insufficient documentation

## 2017-11-11 DIAGNOSIS — K122 Cellulitis and abscess of mouth: Secondary | ICD-10-CM | POA: Diagnosis not present

## 2017-11-11 MED ORDER — OXYCODONE-ACETAMINOPHEN 5-325 MG PO TABS
1.0000 | ORAL_TABLET | Freq: Once | ORAL | Status: AC
  Administered 2017-11-11: 1 via ORAL
  Filled 2017-11-11: qty 1

## 2017-11-11 MED ORDER — OXYCODONE-ACETAMINOPHEN 5-325 MG PO TABS: 1.0000 | ORAL_TABLET | Freq: Three times a day (TID) | ORAL | 0 refills | Status: DC | PRN

## 2017-11-11 MED ORDER — AMOXICILLIN-POT CLAVULANATE 875-125 MG PO TABS: 1.0000 | ORAL_TABLET | Freq: Two times a day (BID) | ORAL | 0 refills | Status: DC

## 2017-11-11 NOTE — ED Provider Notes (Signed)
[facility] EMERGENCY DEPARTMENT Provider Note   CSN: [id number] Arrival date & time: 11/11/17  1626     History   Chief Complaint Chief Complaint  Patient presents with  . Mouth Lesions    HPI Caitlin [name] is a 27 y.o. female.  HPI   27 year old female presents today with complaints of dental and mouth pain.  Patient notes over the last 4 days she has had pain in the right upper lateral dentition and a soft area of fluctuance on the roof of her right upper mouth.  She notes this is extremely uncomfortable.  She notes over-the-counter medications are not helping her symptoms.  She denies any fever chill, swelling throughout the remainder of the face or dentition.  Patient denies any upper respiratory symptoms including nasal congestion or rhinorrhea.  Past Medical History:  Diagnosis Date  . Ovarian cyst     Patient Active Problem List   Diagnosis Date Noted  . Chronic pelvic pain in female 02/11/2013    Past Surgical History:  Procedure Laterality Date  . CESAREAN SECTION  07/07/2007     OB History    Gravida  1   Para  1   Term  1   Preterm      AB      Living  1     SAB      TAB      Ectopic      Multiple      Live Births               Home Medications    Prior to Admission medications   Medication Sig Start Date End Date Taking? Authorizing Provider  amoxicillin-clavulanate (AUGMENTIN) 875-125 MG tablet Take 1 tablet by mouth every 12 (twelve) hours. 11/11/17   Rafik Koppel, Tinnie Gens, PA-C  baclofen (LIORESAL) 10 MG tablet Take 1 tablet (10 mg total) by mouth 3 (three) times daily. 08/06/16   Harris, Abigail, PA-C  benzonatate (TESSALON) 100 MG capsule Take 1 capsule (100 mg total) by mouth every 8 (eight) hours. 04/16/16   Kirichenko, Lemont Fillers, PA-C  ciprofloxacin (CIPRO) 500 MG tablet Take 1 tablet (500 mg total) by mouth 2 (two) times daily. 03/11/16   Burleson, Brand Males, NP  cyclobenzaprine (FLEXERIL) 5 MG tablet Take 1 tablet  (5 mg total) by mouth 3 (three) times daily as needed for muscle spasms. 08/08/17   Willy Eddy, MD  HYDROcodone-acetaminophen (NORCO) 5-325 MG tablet Take 1 tablet by mouth every 4 (four) hours as needed for moderate pain. 08/08/17   Willy Eddy, MD  meloxicam (MOBIC) 15 MG tablet Take 1 tablet (15 mg total) by mouth daily. Take 1 daily with food. 08/06/16   Arthor Captain, PA-C  metroNIDAZOLE (FLAGYL) 500 MG tablet Take 1 tablet (500 mg total) by mouth 2 (two) times daily. 03/07/16   Anyanwu, Jethro Bastos, MD  oxyCODONE-acetaminophen (PERCOCET/ROXICET) 5-325 MG tablet Take 1 tablet by mouth every 8 (eight) hours as needed for severe pain. 11/11/17   Eyvonne Mechanic, PA-C    Family History No family history on file.  Social History Social History   Tobacco Use  . Smoking status: Current Every Day Smoker    Packs/day: 2.00    Types: Cigarettes  . Smokeless tobacco: Never Used  Substance Use Topics  . Alcohol use: No  . Drug use: No     Allergies   Patient has no known allergies.   Review of Systems Review of Systems  All other systems reviewed  and are negative.    Physical Exam Updated Vital Signs BP (!) 135/101 (BP Location: Right Arm)   Pulse 77   Temp 99.1 F (37.3 C) (Oral)   Resp 18   LMP 11/08/2017 (Exact Date)   SpO2 98%   Physical Exam  Constitutional: She is oriented to person, place, and time. She appears well-developed and well-nourished.  HENT:  Head: Normocephalic and atraumatic.  1 cm area of induration and swelling to the right upper hard palate anterior-gumline palpated no swelling or edema jaw full active range of motion  Eyes: Pupils are equal, round, and reactive to light. Conjunctivae are normal. Right eye exhibits no discharge. Left eye exhibits no discharge. No scleral icterus.  Neck: Normal range of motion. No JVD present. No tracheal deviation present.  Pulmonary/Chest: Effort normal. No stridor.  Neurological: She is alert and oriented  to person, place, and time. Coordination normal.  Psychiatric: She has a normal mood and affect. Her behavior is normal. Judgment and thought content normal.  Nursing note and vitals reviewed.    ED Treatments / Results  Labs (all labs ordered are listed, but only abnormal results are displayed) Labs Reviewed - No data to display  EKG None  Radiology No results found.  Procedures .Marland KitchenIncision and Drainage Date/Time: 11/11/2017 9:05 PM Performed by: Eyvonne Mechanic, PA-C Authorized by: Eyvonne Mechanic, PA-C   Consent:    Consent obtained:  Verbal   Consent given by:  Patient   Risks discussed:  Bleeding, damage to other organs, incomplete drainage and infection   Alternatives discussed:  Alternative treatment, delayed treatment and no treatment Location:    Type:  Abscess   Size:  1   Location:  Mouth   Mouth location:  Palate Anesthesia (see MAR for exact dosages):    Anesthesia method:  None Procedure type:    Complexity:  Simple Procedure details:    Incision types:  Stab incision   Scalpel blade:  11   Wound management:  Probed and deloculated   Drainage:  Purulent   Drainage amount:  Moderate   Wound treatment:  Wound left open   Packing materials:  None Post-procedure details:    Patient tolerance of procedure:  Tolerated well, no immediate complications   (including critical care time)  Medications Ordered in ED Medications  oxyCODONE-acetaminophen (PERCOCET/ROXICET) 5-325 MG per tablet 1 tablet (1 tablet Oral Given 11/11/17 1928)     Initial Impression / Assessment and Plan / ED Course  I have reviewed the triage vital signs and the nursing notes.  Pertinent labs & imaging results that were available during my care of the patient were reviewed by me and considered in my medical decision making (see chart for details).     27 year old female presents today with abscess to the roof of her mouth.  I have high suspicion this is dental in origin-patient is  well-appearing, afebrile, I&D was successful here with significant improvement in symptoms.  Patient discharged home with Augmentin, Percocet, outpatient dental follow-up and strict return precautions.  Patient verbalized understanding and agreement to today's plan had no further questions or concerns  Final Clinical Impressions(s) / ED Diagnoses   Final diagnoses:  Mouth abscess    ED Discharge Orders        Ordered    oxyCODONE-acetaminophen (PERCOCET/ROXICET) 5-325 MG tablet  Every 8 hours PRN     11/11/17 2102    amoxicillin-clavulanate (AUGMENTIN) 875-125 MG tablet  Every 12 hours     11/11/17 2102  Eyvonne Mechanic, PA-C 11/11/17 2105    Pricilla Loveless, MD 11/12/17 2256

## 2017-11-11 NOTE — ED Notes (Signed)
Pt left at this time with all belongings.  

## 2017-11-11 NOTE — ED Triage Notes (Signed)
Pt presents for evaluation of mouth pain x 4 days. Denies injury to mouth. Denies dental pain or throat pain. Pt has small raised red area to R roof of mouth.

## 2017-11-11 NOTE — Discharge Instructions (Addendum)
Please read attached information. If you experience any new or worsening signs or symptoms please return to the emergency room for evaluation. Please follow-up with your primary care provider or specialist as discussed. Please use medication prescribed only as directed and discontinue taking if you have any concerning signs or symptoms.   °

## 2018-02-25 IMAGING — CR DG CHEST 2V
2 series · 2 of 2 positions shown · non-contrast
Comparison: 04/16/2016

CLINICAL DATA: Left chest wall pain

EXAM:
CHEST  2 VIEW

[chest pa]
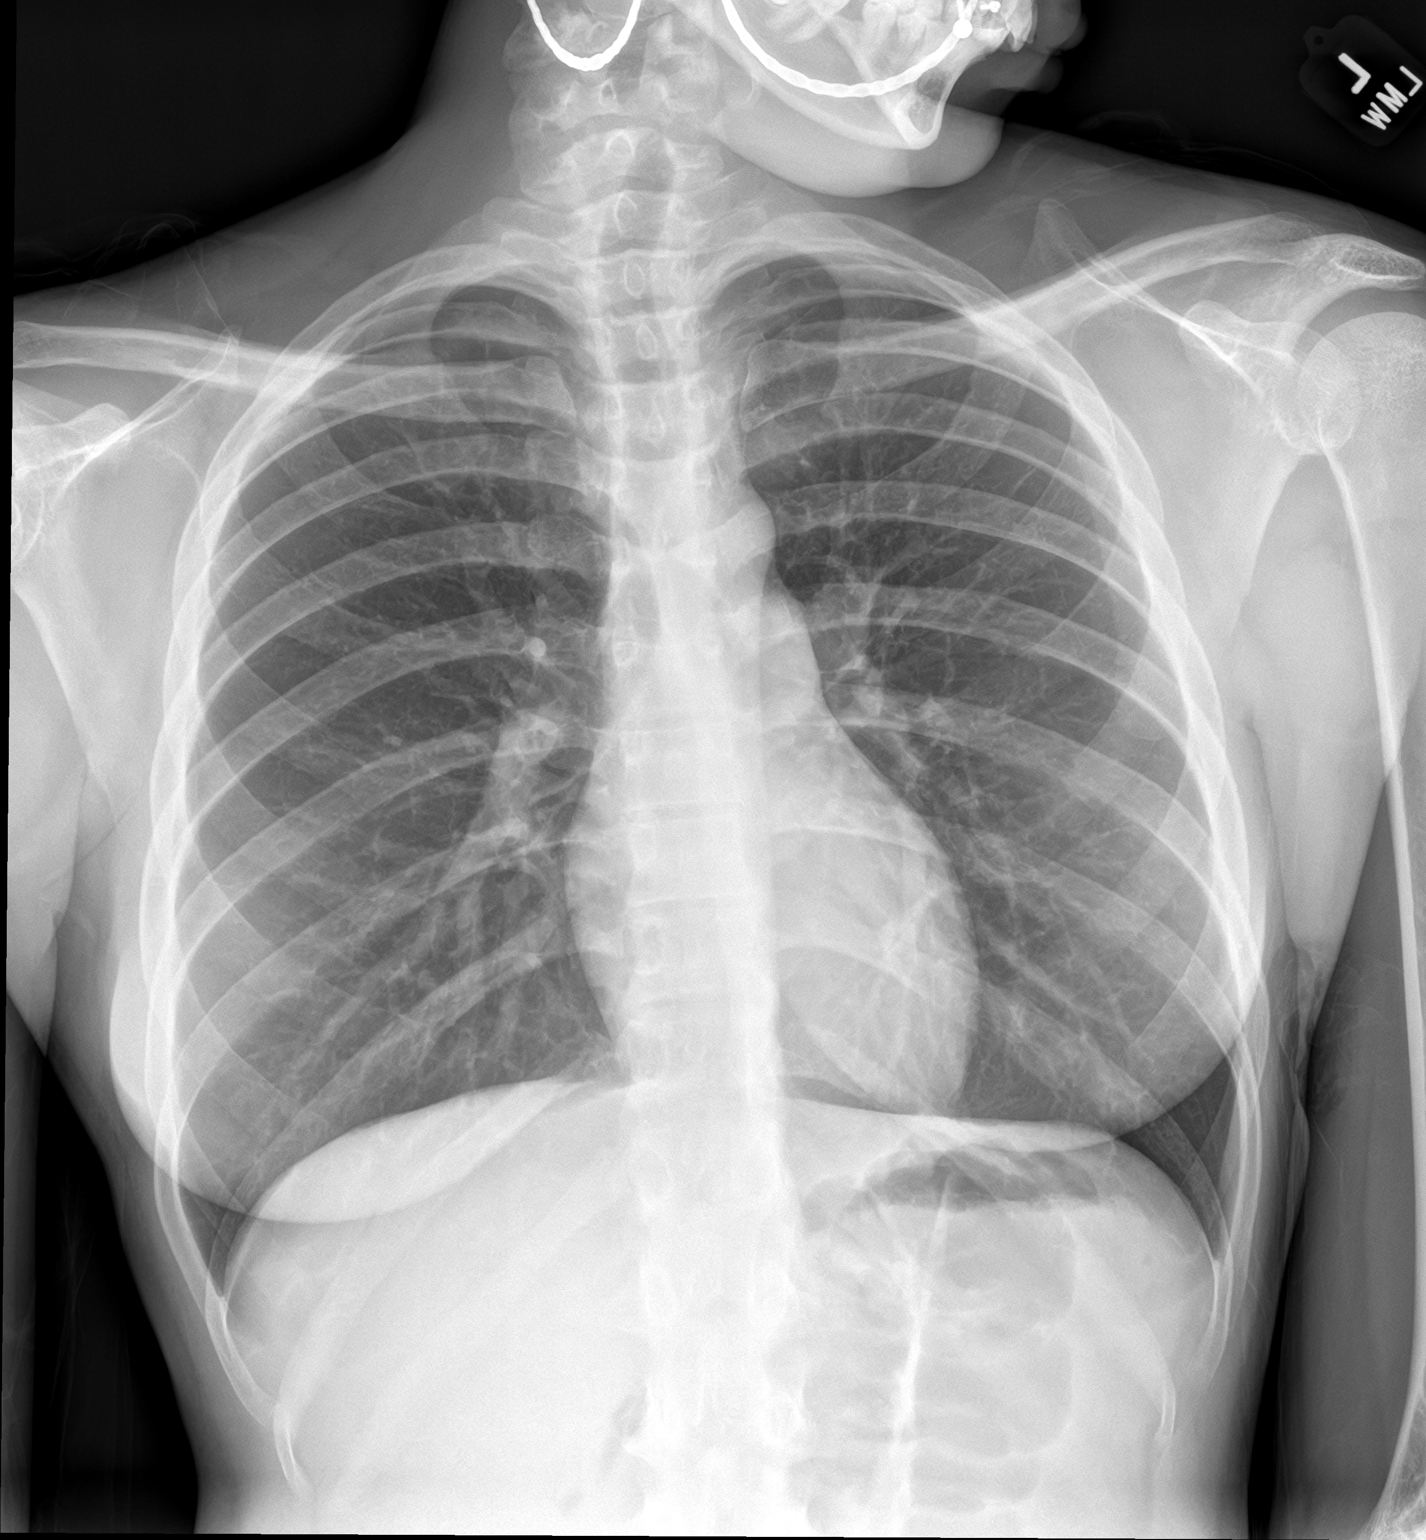

[chest lat]
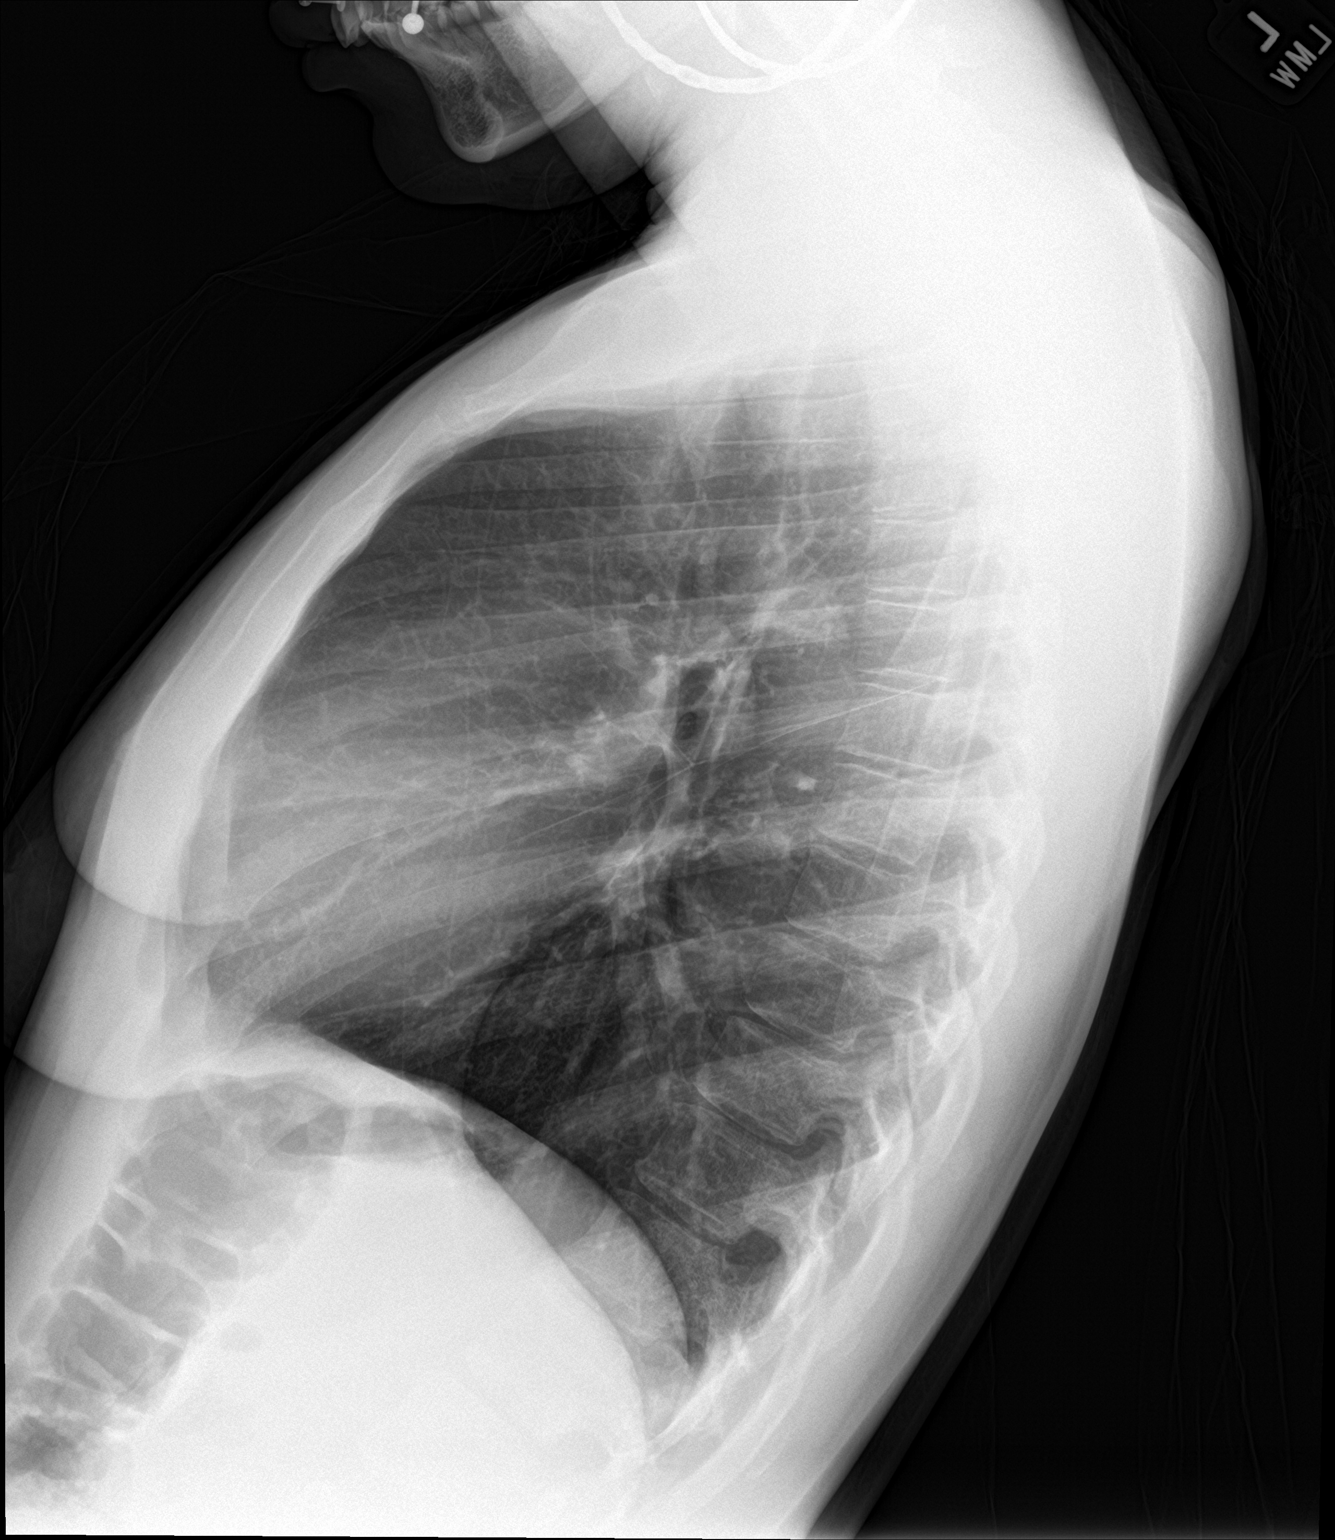

[2 of 2 positions shown; findings below may reference images not displayed]

FINDINGS: The heart size and mediastinal contours are within normal limits.
Both lungs are clear. The visualized skeletal structures are
unremarkable.
IMPRESSION: No active cardiopulmonary disease.

## 2018-06-17 ENCOUNTER — Encounter: Payer: Self-pay | Admitting: Emergency Medicine

## 2018-06-17 ENCOUNTER — Emergency Department
Admission: EM | Admit: 2018-06-17 | Discharge: 2018-06-17 | Disposition: A | Discharge status: Home / Self Care | Payer: Managed Care, Other (non HMO) | Attending: Emergency Medicine | Admitting: Emergency Medicine

## 2018-06-17 ENCOUNTER — Other Ambulatory Visit: Payer: Self-pay

## 2018-06-17 DIAGNOSIS — F17210 Nicotine dependence, cigarettes, uncomplicated: Secondary | ICD-10-CM | POA: Insufficient documentation

## 2018-06-17 DIAGNOSIS — O210 Mild hyperemesis gravidarum: Secondary | ICD-10-CM

## 2018-06-17 DIAGNOSIS — R112 Nausea with vomiting, unspecified: Secondary | ICD-10-CM | POA: Diagnosis present

## 2018-06-17 DIAGNOSIS — O219 Vomiting of pregnancy, unspecified: Secondary | ICD-10-CM | POA: Insufficient documentation

## 2018-06-17 DIAGNOSIS — Z79899 Other long term (current) drug therapy: Secondary | ICD-10-CM | POA: Diagnosis not present

## 2018-06-17 DIAGNOSIS — Z3A01 Less than 8 weeks gestation of pregnancy: Secondary | ICD-10-CM | POA: Diagnosis not present

## 2018-06-17 LAB — URINALYSIS, COMPLETE (UACMP) WITH MICROSCOPIC
BACTERIA UA: NONE SEEN
BILIRUBIN URINE: NEGATIVE
GLUCOSE, UA: NEGATIVE mg/dL
Hgb urine dipstick: NEGATIVE
Ketones, ur: 5 mg/dL — AB
Leukocytes, UA: NEGATIVE
Nitrite: NEGATIVE
PH: 6 (ref 5.0–8.0)
PROTEIN: 30 mg/dL — AB
Specific Gravity, Urine: 1.026 (ref 1.005–1.030)

## 2018-06-17 LAB — COMPREHENSIVE METABOLIC PANEL
ALK PHOS: 59 U/L (ref 38–126)
ALT: 14 U/L (ref 0–44)
AST: 19 U/L (ref 15–41)
Albumin: 4.2 g/dL (ref 3.5–5.0)
Anion gap: 6 (ref 5–15)
BUN: 8 mg/dL (ref 6–20)
CO2: 21 mmol/L — AB (ref 22–32)
CREATININE: 0.7 mg/dL (ref 0.44–1.00)
Calcium: 9.4 mg/dL (ref 8.9–10.3)
Chloride: 107 mmol/L (ref 98–111)
Glucose, Bld: 101 mg/dL — ABNORMAL HIGH (ref 70–99)
Potassium: 3.7 mmol/L (ref 3.5–5.1)
Sodium: 134 mmol/L — ABNORMAL LOW (ref 135–145)
Total Bilirubin: 0.6 mg/dL (ref 0.3–1.2)
Total Protein: 7.2 g/dL (ref 6.5–8.1)

## 2018-06-17 LAB — CBC
HCT: 37.1 % (ref 36.0–46.0)
Hemoglobin: 11.8 g/dL — ABNORMAL LOW (ref 12.0–15.0)
MCH: 26.9 pg (ref 26.0–34.0)
MCHC: 31.8 g/dL (ref 30.0–36.0)
MCV: 84.7 fL (ref 80.0–100.0)
NRBC: 0 % (ref 0.0–0.2)
Platelets: 301 10*3/uL (ref 150–400)
RBC: 4.38 MIL/uL (ref 3.87–5.11)
RDW: 17.8 % — AB (ref 11.5–15.5)
WBC: 4.5 10*3/uL (ref 4.0–10.5)

## 2018-06-17 LAB — PREGNANCY, URINE: Preg Test, Ur: POSITIVE — AB

## 2018-06-17 LAB — LIPASE, BLOOD: Lipase: 26 U/L (ref 11–51)

## 2018-06-17 NOTE — ED Provider Notes (Signed)
[facility] Emergency Department Provider Note   ____________________________________________    I have reviewed the triage vital signs and the nursing notes.   HISTORY  Chief Complaint Nausea and Emesis     HPI Caitlin [name] is a 27 y.o. female who presents today with complaints of nausea and intermittent vomiting over the last 2 weeks.  She denies abdominal pain.  No sick contacts.  No fevers or chills.  Has not taken anything for this.  She does not of cigarettes.  Reports last menstrual cycle in November  Past Medical History:  Diagnosis Date  . Ovarian cyst     Patient Active Problem List   Diagnosis Date Noted  . Chronic pelvic pain in female 02/11/2013    Past Surgical History:  Procedure Laterality Date  . CESAREAN SECTION  07/07/2007    Prior to Admission medications   Medication Sig Start Date End Date Taking? Authorizing Provider  amoxicillin-clavulanate (AUGMENTIN) 875-125 MG tablet Take 1 tablet by mouth every 12 (twelve) hours. 11/11/17   Hedges, Tinnie GensJeffrey, PA-C  baclofen (LIORESAL) 10 MG tablet Take 1 tablet (10 mg total) by mouth 3 (three) times daily. 08/06/16   Harris, Abigail, PA-C  benzonatate (TESSALON) 100 MG capsule Take 1 capsule (100 mg total) by mouth every 8 (eight) hours. 04/16/16   Kirichenko, Lemont Fillersatyana, PA-C  ciprofloxacin (CIPRO) 500 MG tablet Take 1 tablet (500 mg total) by mouth 2 (two) times daily. 03/11/16   Burleson, Brand Maleserri L, NP  cyclobenzaprine (FLEXERIL) 5 MG tablet Take 1 tablet (5 mg total) by mouth 3 (three) times daily as needed for muscle spasms. 08/08/17   Willy Eddyobinson, Patrick, MD  HYDROcodone-acetaminophen (NORCO) 5-325 MG tablet Take 1 tablet by mouth every 4 (four) hours as needed for moderate pain. 08/08/17   Willy Eddyobinson, Patrick, MD  meloxicam (MOBIC) 15 MG tablet Take 1 tablet (15 mg total) by mouth daily. Take 1 daily with food. 08/06/16   Arthor CaptainHarris, Abigail, PA-C  metroNIDAZOLE (FLAGYL) 500 MG tablet Take 1  tablet (500 mg total) by mouth 2 (two) times daily. 03/07/16   Anyanwu, Jethro BastosUgonna A, MD  oxyCODONE-acetaminophen (PERCOCET/ROXICET) 5-325 MG tablet Take 1 tablet by mouth every 8 (eight) hours as needed for severe pain. 11/11/17   Eyvonne MechanicHedges, Jeffrey, PA-C     Allergies Patient has no known allergies.  No family history on file.  Social History Social History   Tobacco Use  . Smoking status: Current Every Day Smoker    Packs/day: 2.00    Types: Cigarettes  . Smokeless tobacco: Never Used  Substance Use Topics  . Alcohol use: No  . Drug use: No    Review of Systems  Constitutional: No fever/chills Eyes: No visual changes.  ENT: No sore throat. Cardiovascular: Denies chest pain. Respiratory: Denies shortness of breath. Gastrointestinal: No abdominal pain.as aboe Genitourinary: Negative for dysuria. Musculoskeletal: Negative for back pain. Skin: Negative for rash. Neurological: Negative for headaches or weakness   ____________________________________________   PHYSICAL EXAM:  VITAL SIGNS: ED Triage Vitals [06/17/18 1301]  Enc Vitals Group     BP 118/69     Pulse Rate 83     Resp 18     Temp 98.4 F (36.9 C)     Temp Source Oral     SpO2 100 %     Weight 62.6 kg (138 lb)     Height 1.753 m (5\' 9" )     Head Circumference      Peak Flow  Pain Score 0     Pain Loc      Pain Edu?    Constitutional: Alert and oriented. No acute distress. Pleasant and interactive Eyes: Conjunctivae are normal.  Throat: Mucous membranes are moist.   Neck:  Painless ROM Cardiovascular: Normal rate, regular rhythm. Peri Jefferson.  Good peripheral circulation. Respiratory: Normal respiratory effort.  No retractions. Gastrointestinal: Soft and nontender. No distention.  No CVA tenderness. Genitourinary: deferred Musculoskeletal: No lower extremity tenderness nor edema.  Warm and well perfused Neurologic:  Normal speech and language. No gross focal neurologic deficits are appreciated.  Skin:  Skin  is warm, dry and intact. No rash noted. Psychiatric: Mood and affect are normal. Speech and behavior are normal.  ____________________________________________   LABS (all labs ordered are listed, but only abnormal results are displayed)  Labs Reviewed  COMPREHENSIVE METABOLIC PANEL - Abnormal; Notable for the following components:      Result Value   Sodium 134 (*)    CO2 21 (*)    Glucose, Bld 101 (*)    All other components within normal limits  CBC - Abnormal; Notable for the following components:   Hemoglobin 11.8 (*)    RDW 17.8 (*)    All other components within normal limits  URINALYSIS, COMPLETE (UACMP) WITH MICROSCOPIC - Abnormal; Notable for the following components:   Color, Urine YELLOW (*)    APPearance CLEAR (*)    Ketones, ur 5 (*)    Protein, ur 30 (*)    All other components within normal limits  PREGNANCY, URINE - Abnormal; Notable for the following components:   Preg Test, Ur POSITIVE (*)    All other components within normal limits  LIPASE, BLOOD   ____________________________________________  EKG   ____________________________________________  RADIOLOGY   ____________________________________________   PROCEDURES  Procedure(s) performed: No  Procedures   Critical Care performed: No ____________________________________________   INITIAL IMPRESSION / ASSESSMENT AND PLAN / ED COURSE  Pertinent labs & imaging results that were available during my care of the patient were reviewed by me and considered in my medical decision making (see chart for details).  Patient presents with nausea x2 weeks, missed last menstruation, pregnancy test is positive, this is likely the cause of her nausea and vomiting.  Counseled extensively smoking cessation and alcohol avoidance, prenatal vitamins, OB follow-up    ____________________________________________   FINAL CLINICAL IMPRESSION(S) / ED DIAGNOSES  Final diagnoses:  Less than [redacted] weeks gestation of  pregnancy  Morning sickness        Note:  This document was prepared using Dragon voice recognition software and may include unintentional dictation errors.   Jene EveryKinner, Gracelynn Bircher, MD 06/17/18 2121

## 2018-06-17 NOTE — ED Notes (Signed)
Unable to keep any food down for a few days, has been able to drink and keep water down, states she could be pregnant, denies pain.

## 2018-06-17 NOTE — ED Triage Notes (Signed)
FIRST NURSE NOTE-here for vomiting. Pt reports unable to keep anything down. Denies pain. Ambulatory. NAD.

## 2018-06-17 NOTE — ED Triage Notes (Signed)
Pt presents to ED via POV with c/o nausea and vomiting x 2 weeks, Pt states has been unable to keep anything down. Pt is alert and oriented x 4, visualized in NAD at this time.

## 2018-06-18 NOTE — L&D Delivery Note (Signed)
       Delivery Note   Caitlin [name] is a 28 y.o. G2P1001 at [redacted]w[redacted]d Estimated Date of Delivery: 01/31/19  PRE-OPERATIVE DIAGNOSIS:  1) [redacted]w[redacted]d pregnancy.    POST-OPERATIVE DIAGNOSIS:  1) [redacted]w[redacted]d pregnancy s/p VBAC, Spontaneous    Delivery Type: VBAC, Spontaneous    Delivery Anesthesia: Epidural   Labor Complications:      ESTIMATED BLOOD LOSS: 75  ml    FINDINGS:   1) female infant, Apgar scores of    at 1 minute and    at 5 minutes and a birthweight of   ounces.    2) Nuchal cord: No  SPECIMENS:   PLACENTA:   Appearance: Intact    Removal: Spontaneous      Disposition:    DISPOSITION:  Infant to left in stable condition in the delivery room, with L&D personnel and mother,  NARRATIVE SUMMARY: Labor course:  [name] is a G2P1001 at [redacted]w[redacted]d who presented for induction of labor for postdates.  Induction was begun in the office with Foley bulb placement..  She progressed well and rapidly in labor with pitocin.  She received the appropriate anesthesia (e;pidural) and proceeded to complete dilation. Patient declined pushing for a while during the second stage but once she became determined she  evidenced good maternal expulsive effort. She went on to deliver a viable infant. The placenta delivered without problems and was noted to be complete. A perineal and vaginal examination was performed. Episiotomy/Lacerations: None  The patient tolerated this well.  Finis Bud, M.D. 02/05/2019 6:05 AM

## 2018-06-21 ENCOUNTER — Emergency Department
Admission: EM | Admit: 2018-06-21 | Discharge: 2018-06-21 | Disposition: A | Discharge status: Home / Self Care | Payer: BLUE CROSS/BLUE SHIELD | Attending: Emergency Medicine | Admitting: Emergency Medicine

## 2018-06-21 ENCOUNTER — Encounter: Payer: Self-pay | Admitting: Emergency Medicine

## 2018-06-21 ENCOUNTER — Other Ambulatory Visit: Payer: Self-pay

## 2018-06-21 DIAGNOSIS — O99331 Smoking (tobacco) complicating pregnancy, first trimester: Secondary | ICD-10-CM | POA: Insufficient documentation

## 2018-06-21 DIAGNOSIS — Z3A00 Weeks of gestation of pregnancy not specified: Secondary | ICD-10-CM | POA: Diagnosis not present

## 2018-06-21 DIAGNOSIS — O210 Mild hyperemesis gravidarum: Secondary | ICD-10-CM | POA: Insufficient documentation

## 2018-06-21 DIAGNOSIS — O219 Vomiting of pregnancy, unspecified: Secondary | ICD-10-CM | POA: Diagnosis not present

## 2018-06-21 DIAGNOSIS — F17210 Nicotine dependence, cigarettes, uncomplicated: Secondary | ICD-10-CM | POA: Insufficient documentation

## 2018-06-21 LAB — URINALYSIS, COMPLETE (UACMP) WITH MICROSCOPIC
BACTERIA UA: NONE SEEN
BILIRUBIN URINE: NEGATIVE
Glucose, UA: NEGATIVE mg/dL
HGB URINE DIPSTICK: NEGATIVE
KETONES UR: 80 mg/dL — AB
Leukocytes, UA: NEGATIVE
Nitrite: NEGATIVE
Protein, ur: 30 mg/dL — AB
Specific Gravity, Urine: 1.025 (ref 1.005–1.030)
pH: 5 (ref 5.0–8.0)

## 2018-06-21 LAB — CBC
HEMATOCRIT: 40.2 % (ref 36.0–46.0)
HEMOGLOBIN: 13 g/dL (ref 12.0–15.0)
MCH: 27 pg (ref 26.0–34.0)
MCHC: 32.3 g/dL (ref 30.0–36.0)
MCV: 83.6 fL (ref 80.0–100.0)
NRBC: 0 % (ref 0.0–0.2)
Platelets: 303 10*3/uL (ref 150–400)
RBC: 4.81 MIL/uL (ref 3.87–5.11)
RDW: 17.4 % — ABNORMAL HIGH (ref 11.5–15.5)
WBC: 4.9 10*3/uL (ref 4.0–10.5)

## 2018-06-21 LAB — COMPREHENSIVE METABOLIC PANEL
ALBUMIN: 4.4 g/dL (ref 3.5–5.0)
ALT: 12 U/L (ref 0–44)
ANION GAP: 9 (ref 5–15)
AST: 21 U/L (ref 15–41)
Alkaline Phosphatase: 62 U/L (ref 38–126)
BILIRUBIN TOTAL: 0.7 mg/dL (ref 0.3–1.2)
BUN: 6 mg/dL (ref 6–20)
CO2: 18 mmol/L — AB (ref 22–32)
CREATININE: 0.57 mg/dL (ref 0.44–1.00)
Calcium: 9.3 mg/dL (ref 8.9–10.3)
Chloride: 106 mmol/L (ref 98–111)
GFR calc Af Amer: >60 mL/min (ref >60)
GFR calc non Af Amer: >60 mL/min (ref >60)
GLUCOSE: 86 mg/dL (ref 70–99)
POTASSIUM: 4 mmol/L (ref 3.5–5.1)
Sodium: 133 mmol/L — ABNORMAL LOW (ref 135–145)
Total Protein: 7.3 g/dL (ref 6.5–8.1)

## 2018-06-21 LAB — LIPASE, BLOOD: LIPASE: 24 U/L (ref 11–51)

## 2018-06-21 LAB — HCG, QUANTITATIVE, PREGNANCY: hCG, Beta Chain, Quant, S: 130502 m[IU]/mL — ABNORMAL HIGH (ref <5)

## 2018-06-21 MED ORDER — SODIUM CHLORIDE 0.9 % IV SOLN
Freq: Once | INTRAVENOUS | Status: AC
  Administered 2018-06-21: 19:00:00 via INTRAVENOUS

## 2018-06-21 MED ORDER — ONDANSETRON HCL 4 MG/2ML IJ SOLN
4.0000 mg | Freq: Once | INTRAMUSCULAR | Status: AC
  Administered 2018-06-21: 4 mg via INTRAVENOUS
  Filled 2018-06-21: qty 2

## 2018-06-21 MED ORDER — PROMETHAZINE HCL 25 MG PO TABS: 25.0000 mg | ORAL_TABLET | ORAL | 1 refills | Status: DC | PRN

## 2018-06-21 NOTE — ED Provider Notes (Signed)
[facility] Emergency Department Provider Note       Time seen: ----------------------------------------- 6:46 PM on 06/21/2018 -----------------------------------------   I have reviewed the triage vital signs and the nursing notes.  HISTORY   Chief Complaint Emesis During Pregnancy   HPI Caitlin [name] is a 28 y.o. female with a history of ovarian cysts who presents to the ED for vomiting early pregnancy.  Patient's not sure how far along she is.  She has had nausea and vomiting whenever she eats.  She thought she saw some blood in her vomit today.  She denies fevers, chills, vaginal bleeding or other complaints.  Past Medical History:  Diagnosis Date  . Ovarian cyst     Patient Active Problem List   Diagnosis Date Noted  . Chronic pelvic pain in female 02/11/2013    Past Surgical History:  Procedure Laterality Date  . CESAREAN SECTION  07/07/2007    Allergies Patient has no known allergies.  Social History Social History   Tobacco Use  . Smoking status: Current Every Day Smoker    Packs/day: 2.00    Types: Cigarettes  . Smokeless tobacco: Never Used  Substance Use Topics  . Alcohol use: No  . Drug use: No   Review of Systems Constitutional: Negative for fever. Cardiovascular: Negative for chest pain. Respiratory: Negative for shortness of breath. Gastrointestinal: Negative for abdominal pain, positive for vomiting Musculoskeletal: Negative for back pain. Skin: Negative for rash. Neurological: Negative for headaches, focal weakness or numbness.  All systems negative/normal/unremarkable except as stated in the HPI  ____________________________________________   PHYSICAL EXAM:  VITAL SIGNS: ED Triage Vitals [06/21/18 1528]  Enc Vitals Group     BP      Pulse      Resp      Temp      Temp src      SpO2      Weight 138 lb (62.6 kg)     Height 5\' 9"  (1.753 m)     Head Circumference      Peak Flow      Pain Score 0   Pain Loc      Pain Edu?      Excl. in GC?    Constitutional: Alert and oriented. Well appearing and in no distress. Eyes: Conjunctivae are normal. Normal extraocular movements. Cardiovascular: Normal rate, regular rhythm. No murmurs, rubs, or gallops. Respiratory: Normal respiratory effort without tachypnea nor retractions. Breath sounds are clear and equal bilaterally. No wheezes/rales/rhonchi. Gastrointestinal: Soft and nontender. Normal bowel sounds Musculoskeletal: Nontender with normal range of motion in extremities. No lower extremity tenderness nor edema. Neurologic:  Normal speech and language. No gross focal neurologic deficits are appreciated.  Skin:  Skin is warm, dry and intact. No rash noted. Psychiatric: Mood and affect are normal. Speech and behavior are normal.  ____________________________________________  ED COURSE:  As part of my medical decision making, I reviewed the following data within the electronic MEDICAL RECORD NUMBER History obtained from family if available, nursing notes, old chart and ekg, as well as notes from prior ED visits. Patient presented for vomiting in early pregnancy, we will assess with labs as indicated at this time.   Procedures ____________________________________________   LABS (pertinent positives/negatives)  Labs Reviewed  COMPREHENSIVE METABOLIC PANEL - Abnormal; Notable for the following components:      Result Value   Sodium 133 (*)    CO2 18 (*)    All other components within normal limits  CBC -  Abnormal; Notable for the following components:   RDW 17.4 (*)    All other components within normal limits  URINALYSIS, COMPLETE (UACMP) WITH MICROSCOPIC - Abnormal; Notable for the following components:   Color, Urine YELLOW (*)    APPearance HAZY (*)    Ketones, ur 80 (*)    Protein, ur 30 (*)    All other components within normal limits  HCG, QUANTITATIVE, PREGNANCY - Abnormal; Notable for the following components:   hCG, Beta  Chain, Quant, S 130,502 (*)    All other components within normal limits  LIPASE, BLOOD   ____________________________________________   DIFFERENTIAL DIAGNOSIS   Normal pregnancy, hyperemesis, dehydration, electrolyte abnormality  FINAL ASSESSMENT AND PLAN  Hyperemesis gravidarum   Plan: The patient had presented for persistent vomiting in early pregnancy. Patient's labs were grossly unremarkable.  She did not require any imaging during this visit.  We gave her IV fluids and antiemetics.  She will be discharged home with antiemetics to take as needed.  I will also encourage Pepcid or Zantac as an antacid.   Ulice DashJohnathan E Janella Rogala, MD    Note: This note was generated in part or whole with voice recognition software. Voice recognition is usually quite accurate but there are transcription errors that can and very often do occur. I apologize for any typographical errors that were not detected and corrected.     Emily FilbertWilliams, Rockne Dearinger E, MD 06/21/18 [phone]

## 2018-06-21 NOTE — ED Notes (Signed)
Patient given ginger ale and saltines per Dr. Mayford Knife.

## 2018-06-21 NOTE — ED Triage Notes (Addendum)
Pt arrived via POV with reports of recently finding out she was pregnant, reports she started vomiting today that had what looked like blood in emesis.   Pt reports the only time she has abdominal pain is when she is vomiting. Pt reports some intermittent diarrhea.   Pt reports multiple episodes of vomiting today.

## 2018-06-21 NOTE — ED Notes (Addendum)
Pt asking about why she needs blood work, explained to the patient that since she is reporting vomiting blood we will need to check her blood counts and her potassium and sodium level.    Pt states she is not currently taking any PNV and has not followed up with OB-GYN

## 2018-06-21 NOTE — ED Notes (Signed)
Pt unable to urinate at this time. Pt given specimen cup while in the lobby.

## 2018-06-21 NOTE — ED Notes (Signed)
Patient states she doesn't want saltines, she wants "real food."

## 2018-07-07 ENCOUNTER — Other Ambulatory Visit: Payer: Self-pay | Admitting: Obstetrics and Gynecology

## 2018-07-07 ENCOUNTER — Ambulatory Visit (INDEPENDENT_AMBULATORY_CARE_PROVIDER_SITE_OTHER): Payer: Managed Care, Other (non HMO) | Admitting: Obstetrics and Gynecology

## 2018-07-07 VITALS — BP 124/83 | HR 93 | Ht 69.0 in | Wt 135.0 lb

## 2018-07-07 DIAGNOSIS — Z3687 Encounter for antenatal screening for uncertain dates: Secondary | ICD-10-CM | POA: Insufficient documentation

## 2018-07-07 DIAGNOSIS — N926 Irregular menstruation, unspecified: Secondary | ICD-10-CM

## 2018-07-07 LAB — OB RESULTS CONSOLE VARICELLA ZOSTER ANTIBODY, IGG: Varicella: IMMUNE

## 2018-07-07 NOTE — Progress Notes (Signed)
[facility] presents for NOB nurse interview visit. Pregnancy confirmation done at [facility]. G-2 .  P- 1   . Pregnancy education material explained and given.  (sickle cell). HIV labs and Drug screen were explained optional and she did not decline. Drug screen ordered/declined. PNV encouraged. Genetic screening options discussed. Genetic testing:/Unsure.  Pt may discuss with provider. Pt is unsure of LMP had beta @ [facility] 06/21/18 130,502.  Pt is scheduled for [place] 07/08/18 for dating/viability, then she needs appointment with Dr. Valentino Saxon.  Will make appt after [place] 07/08/18

## 2018-07-07 NOTE — Patient Instructions (Signed)
First Trimester of Pregnancy  The first trimester of pregnancy is from week 1 until the end of week 13 (months 1 through 3). During this time, your baby will begin to develop inside you. At 6-8 weeks, the eyes and face are formed, and the heartbeat can be seen on ultrasound. At the end of 12 weeks, all the baby's organs are formed. Prenatal care is all the medical care you receive before the birth of your baby. Make sure you get good prenatal care and follow all of your doctor's instructions. Follow these instructions at home: Medicines  Take over-the-counter and prescription medicines only as told by your doctor. Some medicines are safe and some medicines are not safe during pregnancy.  Take a prenatal vitamin that contains at least 600 micrograms (mcg) of folic acid.  If you have trouble pooping (constipation), take medicine that will make your stool soft (stool softener) if your doctor approves. Eating and drinking   Eat regular, healthy meals.  Your doctor will tell you the amount of weight gain that is right for you.  Avoid raw meat and uncooked cheese.  If you feel sick to your stomach (nauseous) or throw up (vomit): ? Eat 4 or 5 small meals a day instead of 3 large meals. ? Try eating a few soda crackers. ? Drink liquids between meals instead of during meals.  To prevent constipation: ? Eat foods that are high in fiber, like fresh fruits and vegetables, whole grains, and beans. ? Drink enough fluids to keep your pee (urine) clear or pale yellow. Activity  Exercise only as told by your doctor. Stop exercising if you have cramps or pain in your lower belly (abdomen) or low back.  Do not exercise if it is too hot, too humid, or if you are in a place of great height (high altitude).  Try to avoid standing for long periods of time. Move your legs often if you must stand in one place for a long time.  Avoid heavy lifting.  Wear low-heeled shoes. Sit and stand up straight.   You can have sex unless your doctor tells you not to. Relieving pain and discomfort  Wear a good support bra if your breasts are sore.  Take warm water baths (sitz baths) to soothe pain or discomfort caused by hemorrhoids. Use hemorrhoid cream if your doctor says it is okay.  Rest with your legs raised if you have leg cramps or low back pain.  If you have puffy, bulging veins (varicose veins) in your legs: ? Wear support hose or compression stockings as told by your doctor. ? Raise (elevate) your feet for 15 minutes, 3-4 times a day. ? Limit salt in your food. Prenatal care  Schedule your prenatal visits by the twelfth week of pregnancy.  Write down your questions. Take them to your prenatal visits.  Keep all your prenatal visits as told by your doctor. This is important. Safety  Wear your seat belt at all times when driving.  Make a list of emergency phone numbers. The list should include numbers for family, friends, the hospital, and police and fire departments. General instructions  Ask your doctor for a referral to a local prenatal class. Begin classes no later than at the start of month 6 of your pregnancy.  Ask for help if you need counseling or if you need help with nutrition. Your doctor can give you advice or tell you where to go for help.  Do not use hot tubs, steam   rooms, or saunas.  Do not douche or use tampons or scented sanitary pads.  Do not cross your legs for long periods of time.  Avoid all herbs and alcohol. Avoid drugs that are not approved by your doctor.  Do not use any tobacco products, including cigarettes, chewing tobacco, and electronic cigarettes. If you need help quitting, ask your doctor. You may get counseling or other support to help you quit.  Avoid cat litter boxes and soil used by cats. These carry germs that can cause birth defects in the baby and can cause a loss of your baby (miscarriage) or stillbirth.  Visit your dentist. At home,  brush your teeth with a soft toothbrush. Be gentle when you floss. Contact a doctor if:  You are dizzy.  You have mild cramps or pressure in your lower belly.  You have a nagging pain in your belly area.  You continue to feel sick to your stomach, you throw up, or you have watery poop (diarrhea).  You have a bad smelling fluid coming from your vagina.  You have pain when you pee (urinate).  You have increased puffiness (swelling) in your face, hands, legs, or ankles. Get help right away if:  You have a fever.  You are leaking fluid from your vagina.  You have spotting or bleeding from your vagina.  You have very bad belly cramping or pain.  You gain or lose weight rapidly.  You throw up blood. It may look like coffee grounds.  You are around people who have German measles, fifth disease, or chickenpox.  You have a very bad headache.  You have shortness of breath.  You have any kind of trauma, such as from a fall or a car accident. Summary  The first trimester of pregnancy is from week 1 until the end of week 13 (months 1 through 3).  To take care of yourself and your unborn baby, you will need to eat healthy meals, take medicines only if your doctor tells you to do so, and do activities that are safe for you and your baby.  Keep all follow-up visits as told by your doctor. This is important as your doctor will have to ensure that your baby is healthy and growing well. This information is not intended to replace advice given to you by your health care provider. Make sure you discuss any questions you have with your health care provider. Document Released: 11/21/2007 Document Revised: 06/12/2016 Document Reviewed: 06/12/2016 Elsevier Interactive Patient Education  2019 [employer]  

## 2018-07-07 NOTE — Progress Notes (Signed)
I have reviewed the record and concur with patient management and plan.  Espen Bethel, MD [facility]     

## 2018-07-08 ENCOUNTER — Encounter: Payer: Managed Care, Other (non HMO) | Admitting: Obstetrics and Gynecology

## 2018-07-08 ENCOUNTER — Ambulatory Visit (INDEPENDENT_AMBULATORY_CARE_PROVIDER_SITE_OTHER): Payer: BLUE CROSS/BLUE SHIELD

## 2018-07-08 DIAGNOSIS — Z3687 Encounter for antenatal screening for uncertain dates: Secondary | ICD-10-CM

## 2018-07-08 DIAGNOSIS — N926 Irregular menstruation, unspecified: Secondary | ICD-10-CM | POA: Diagnosis not present

## 2018-07-08 LAB — HEPATITIS B SURFACE ANTIGEN: Hepatitis B Surface Ag: NEGATIVE

## 2018-07-08 LAB — ABO AND RH: Rh Factor: POSITIVE

## 2018-07-08 LAB — HGB SOLU + RFLX FRAC: SICKLE SOLUBILITY TEST - HGBRFX: NEGATIVE

## 2018-07-08 LAB — HIV ANTIBODY (ROUTINE TESTING W REFLEX): HIV Screen 4th Generation wRfx: NONREACTIVE

## 2018-07-08 LAB — RUBELLA SCREEN: Rubella Antibodies, IGG: 4.51 index (ref >0.99)

## 2018-07-08 LAB — VARICELLA ZOSTER ANTIBODY, IGG: Varicella zoster IgG: 1183 index (ref >165)

## 2018-07-08 LAB — ANTIBODY SCREEN: Antibody Screen: NEGATIVE

## 2018-07-08 LAB — RPR: RPR Ser Ql: NONREACTIVE

## 2018-07-09 ENCOUNTER — Other Ambulatory Visit: Payer: Managed Care, Other (non HMO)

## 2018-07-09 ENCOUNTER — Encounter: Payer: Managed Care, Other (non HMO) | Admitting: Obstetrics and Gynecology

## 2018-07-09 LAB — URINALYSIS, ROUTINE W REFLEX MICROSCOPIC
Bilirubin, UA: NEGATIVE
GLUCOSE, UA: NEGATIVE
KETONES UA: NEGATIVE
LEUKOCYTES UA: NEGATIVE
NITRITE UA: NEGATIVE
RBC, UA: NEGATIVE
Urobilinogen, Ur: 0.2 mg/dL (ref 0.2–1.0)
pH, UA: 5.5 (ref 5.0–7.5)

## 2018-07-10 LAB — URINE CULTURE: Organism ID, Bacteria: NO GROWTH

## 2018-07-10 LAB — GC/CHLAMYDIA PROBE AMP
Chlamydia trachomatis, NAA: NEGATIVE
Neisseria gonorrhoeae by PCR: NEGATIVE

## 2018-07-13 LAB — MONITOR DRUG PROFILE 14(MW)
Amphetamine Scrn, Ur: NEGATIVE ng/mL
BARBITURATE SCREEN URINE: NEGATIVE ng/mL
BENZODIAZEPINE SCREEN, URINE: NEGATIVE ng/mL
Buprenorphine, Urine: NEGATIVE ng/mL
COCAINE(METAB.)SCREEN, URINE: NEGATIVE ng/mL
CREATININE(CRT), U: 215.6 mg/dL (ref 20.0–300.0)
Fentanyl, Urine: NEGATIVE pg/mL
METHADONE SCREEN, URINE: NEGATIVE ng/mL
Meperidine Screen, Urine: NEGATIVE ng/mL
OXYCODONE+OXYMORPHONE UR QL SCN: NEGATIVE ng/mL
Opiate Scrn, Ur: NEGATIVE ng/mL
Ph of Urine: 5.7 (ref 4.5–8.9)
Phencyclidine Qn, Ur: NEGATIVE ng/mL
Propoxyphene Scrn, Ur: NEGATIVE ng/mL
SPECIFIC GRAVITY: 1.028
Tramadol Screen, Urine: NEGATIVE ng/mL

## 2018-07-13 LAB — CANNABINOID (GC/MS), URINE
Cannabinoid: POSITIVE — AB
Carboxy THC (GC/MS): 21 ng/mL

## 2018-07-22 ENCOUNTER — Other Ambulatory Visit (HOSPITAL_COMMUNITY)
Admission: RE | Admit: 2018-07-22 | Discharge: 2018-07-22 | Disposition: A | Discharge status: Home / Self Care | Payer: BLUE CROSS/BLUE SHIELD | Source: Ambulatory Visit | Attending: Obstetrics and Gynecology | Admitting: Obstetrics and Gynecology

## 2018-07-22 ENCOUNTER — Ambulatory Visit (INDEPENDENT_AMBULATORY_CARE_PROVIDER_SITE_OTHER): Payer: Managed Care, Other (non HMO) | Admitting: Obstetrics and Gynecology

## 2018-07-22 ENCOUNTER — Encounter: Payer: Self-pay | Admitting: Obstetrics and Gynecology

## 2018-07-22 VITALS — BP 111/74 | HR 84 | Ht 69.0 in | Wt 143.2 lb

## 2018-07-22 DIAGNOSIS — Z8759 Personal history of other complications of pregnancy, childbirth and the puerperium: Secondary | ICD-10-CM

## 2018-07-22 DIAGNOSIS — Z3A14 14 weeks gestation of pregnancy: Secondary | ICD-10-CM

## 2018-07-22 DIAGNOSIS — Z3401 Encounter for supervision of normal first pregnancy, first trimester: Secondary | ICD-10-CM

## 2018-07-22 DIAGNOSIS — O34219 Maternal care for unspecified type scar from previous cesarean delivery: Secondary | ICD-10-CM

## 2018-07-22 DIAGNOSIS — O09292 Supervision of pregnancy with other poor reproductive or obstetric history, second trimester: Secondary | ICD-10-CM

## 2018-07-22 LAB — POCT URINALYSIS DIPSTICK OB
Bilirubin, UA: NEGATIVE
Blood, UA: NEGATIVE
GLUCOSE, UA: NEGATIVE
KETONES UA: NEGATIVE
Leukocytes, UA: NEGATIVE
Nitrite, UA: NEGATIVE
POC,PROTEIN,UA: NEGATIVE
Spec Grav, UA: 1.01 (ref 1.010–1.025)
Urobilinogen, UA: 0.2 E.U./dL
pH, UA: 6.5 (ref 5.0–8.0)

## 2018-07-22 NOTE — Progress Notes (Signed)
NOB: History of PIH -patient started on ASA 81.  History of cesarean delivery for fetal indications at 36 weeks during induction for PIH.  Patient considering TOLAC.  (Likely a good candidate based on history and has an adequate pelvis by exam.)  Patient considering genetic testing but wants to check with her insurance first.  Considering AFP at next visit.  Currently taking prenatal vitamins.  Discussed constipation and literature given.   Physical examination General NAD, Conversant  HEENT Atraumatic; Op clear with mmm.  Normo-cephalic. Pupils reactive. Anicteric sclerae  Thyroid/Neck Smooth without nodularity or enlargement. Normal ROM.  Neck Supple.  Skin No rashes, lesions or ulceration. Normal palpated skin turgor. No nodularity.  Breasts: No masses or discharge.  Symmetric.  No axillary adenopathy.  Lungs: Clear to auscultation.No rales or wheezes. Normal Respiratory effort, no retractions.  Heart: NSR.  No murmurs or rubs appreciated. No periferal edema  Abdomen: Soft.  Non-tender.  No masses.  No HSM. No hernia  Extremities: Moves all appropriately.  Normal ROM for age. No lymphadenopathy.  Neuro: Oriented to PPT.  Normal mood. Normal affect.     Pelvic:   Vulva: Normal appearance.  No lesions.  Vagina: No lesions or abnormalities noted.  Support: Normal pelvic support.  Urethra No masses tenderness or scarring.  Meatus Normal size without lesions or prolapse.  Cervix: Normal appearance.  No lesions.  Anus: Normal exam.  No lesions.  Perineum: Normal exam.  No lesions.        Bimanual   Adnexae: No masses.  Non-tender to palpation.  Uterus: Enlarged. 14 wks  POS  FHT's  Non-tender.  Mobile.  AV.  Adnexae: No masses.  Non-tender to palpation.  Cul-de-sac: Negative for abnormality.  Adnexae: No masses.  Non-tender to palpation.         Pelvimetry   Diagonal: Reached.  Spines: Average.  Sacrum: Concave.  Pubic Arch: Normal.

## 2018-07-22 NOTE — Patient Instructions (Signed)

## 2018-07-22 NOTE — Progress Notes (Signed)
Patient comes in today for new OB visit. She needs a Pap today. Patient is unsure if she has ever had a PAP.  Patient is [redacted]w[redacted]d. She is having problems with constipation.

## 2018-07-24 LAB — CYTOLOGY - PAP
Chlamydia: NEGATIVE
Diagnosis: NEGATIVE
NEISSERIA GONORRHEA: NEGATIVE

## 2018-07-29 ENCOUNTER — Emergency Department: Payer: BLUE CROSS/BLUE SHIELD

## 2018-07-29 ENCOUNTER — Other Ambulatory Visit: Payer: Self-pay

## 2018-07-29 ENCOUNTER — Encounter: Payer: Self-pay | Admitting: Emergency Medicine

## 2018-07-29 ENCOUNTER — Emergency Department
Admission: EM | Admit: 2018-07-29 | Discharge: 2018-07-29 | Disposition: A | Discharge status: Home / Self Care | Payer: BLUE CROSS/BLUE SHIELD | Attending: Student in an Organized Health Care Education/Training Program | Admitting: Student in an Organized Health Care Education/Training Program

## 2018-07-29 DIAGNOSIS — R7989 Other specified abnormal findings of blood chemistry: Secondary | ICD-10-CM | POA: Diagnosis not present

## 2018-07-29 DIAGNOSIS — O99331 Smoking (tobacco) complicating pregnancy, first trimester: Secondary | ICD-10-CM | POA: Insufficient documentation

## 2018-07-29 DIAGNOSIS — F17210 Nicotine dependence, cigarettes, uncomplicated: Secondary | ICD-10-CM | POA: Insufficient documentation

## 2018-07-29 DIAGNOSIS — R0602 Shortness of breath: Secondary | ICD-10-CM | POA: Insufficient documentation

## 2018-07-29 DIAGNOSIS — Z3A12 12 weeks gestation of pregnancy: Secondary | ICD-10-CM | POA: Insufficient documentation

## 2018-07-29 DIAGNOSIS — R791 Abnormal coagulation profile: Secondary | ICD-10-CM | POA: Insufficient documentation

## 2018-07-29 DIAGNOSIS — O99511 Diseases of the respiratory system complicating pregnancy, first trimester: Secondary | ICD-10-CM | POA: Insufficient documentation

## 2018-07-29 DIAGNOSIS — R0789 Other chest pain: Secondary | ICD-10-CM | POA: Insufficient documentation

## 2018-07-29 LAB — FIBRIN DERIVATIVES D-DIMER (ARMC ONLY): Fibrin derivatives D-dimer (ARMC): 511.26 ng/mL (FEU) — ABNORMAL HIGH (ref 0.00–499.00)

## 2018-07-29 LAB — COMPREHENSIVE METABOLIC PANEL
ALBUMIN: 3.9 g/dL (ref 3.5–5.0)
ALT: 13 U/L (ref 0–44)
AST: 19 U/L (ref 15–41)
Alkaline Phosphatase: 45 U/L (ref 38–126)
Anion gap: 6 (ref 5–15)
BUN: 8 mg/dL (ref 6–20)
CO2: 21 mmol/L — ABNORMAL LOW (ref 22–32)
CREATININE: 0.61 mg/dL (ref 0.44–1.00)
Calcium: 9.8 mg/dL (ref 8.9–10.3)
Chloride: 107 mmol/L (ref 98–111)
GFR calc Af Amer: >60 mL/min (ref >60)
GFR calc non Af Amer: >60 mL/min (ref >60)
Glucose, Bld: 96 mg/dL (ref 70–99)
Potassium: 4.6 mmol/L (ref 3.5–5.1)
Sodium: 134 mmol/L — ABNORMAL LOW (ref 135–145)
Total Bilirubin: 0.8 mg/dL (ref 0.3–1.2)
Total Protein: 7.3 g/dL (ref 6.5–8.1)

## 2018-07-29 LAB — CBC WITH DIFFERENTIAL/PLATELET
ABS IMMATURE GRANULOCYTES: 0.02 10*3/uL (ref 0.00–0.07)
Basophils Absolute: 0 10*3/uL (ref 0.0–0.1)
Basophils Relative: 1 %
Eosinophils Absolute: 0.1 10*3/uL (ref 0.0–0.5)
Eosinophils Relative: 1 %
HCT: 38.9 % (ref 36.0–46.0)
Hemoglobin: 13.1 g/dL (ref 12.0–15.0)
IMMATURE GRANULOCYTES: 0 %
LYMPHS PCT: 19 %
Lymphs Abs: 1.1 10*3/uL (ref 0.7–4.0)
MCH: 29.9 pg (ref 26.0–34.0)
MCHC: 33.7 g/dL (ref 30.0–36.0)
MCV: 88.8 fL (ref 80.0–100.0)
Monocytes Absolute: 0.5 10*3/uL (ref 0.1–1.0)
Monocytes Relative: 9 %
NEUTROS ABS: 4 10*3/uL (ref 1.7–7.7)
Neutrophils Relative %: 70 %
Platelets: 292 10*3/uL (ref 150–400)
RBC: 4.38 MIL/uL (ref 3.87–5.11)
RDW: 19.5 % — ABNORMAL HIGH (ref 11.5–15.5)
WBC: 5.7 10*3/uL (ref 4.0–10.5)
nRBC: 0 % (ref 0.0–0.2)

## 2018-07-29 LAB — TROPONIN I: Troponin I: <0.03 ng/mL (ref <0.03)

## 2018-07-29 MED ORDER — ALBUTEROL SULFATE (2.5 MG/3ML) 0.083% IN NEBU
2.5000 mg | INHALATION_SOLUTION | Freq: Once | RESPIRATORY_TRACT | Status: AC
  Administered 2018-07-29: 2.5 mg via RESPIRATORY_TRACT
  Filled 2018-07-29: qty 3

## 2018-07-29 MED ORDER — DEXAMETHASONE 4 MG PO TABS
6.0000 mg | ORAL_TABLET | Freq: Once | ORAL | Status: AC
  Administered 2018-07-29: 6 mg via ORAL
  Filled 2018-07-29: qty 1.5

## 2018-07-29 MED ORDER — ACETAMINOPHEN 500 MG PO TABS
1000.0000 mg | ORAL_TABLET | Freq: Once | ORAL | Status: AC
  Administered 2018-07-29: 1000 mg via ORAL
  Filled 2018-07-29: qty 2

## 2018-07-29 MED ORDER — ALBUTEROL SULFATE 108 (90 BASE) MCG/ACT IN AEPB: 2.0000 | INHALATION_SPRAY | Freq: Three times a day (TID) | RESPIRATORY_TRACT | 0 refills | Status: AC | PRN

## 2018-07-29 NOTE — ED Notes (Signed)
Pt is 3 months pregnant with 2nd child - she started yesterday while at work with chest pain and shortness of breath, both have increased over the last 24 hours - pt appears in NAD but is having some difficulty speaking in complete sentences at times - she denies any pain in her legs or dependent edema

## 2018-07-29 NOTE — ED Provider Notes (Signed)
[facility] Emergency Department Provider Note    First MD Initiated Contact with Patient 07/29/18 [phone]     (approximate)  I have reviewed the triage vital signs and the nursing notes.   HISTORY  Chief Complaint Shortness of Breath and Chest Pain    HPI Caitlin CreeksFelisha [name] is a 28 y.o. female presents to the ER for evaluation of 24 hours of shortness of breath and left-sided chest discomfort.  Does hurt when she takes deep inspiration.  Denies any fevers.  No nausea or vomiting.  She is 3 months pregnant.  Denies any lower extremity swelling.  No history of blood clots.  Does not currently smoke.  Started feeling short of breath while she was at work yesterday.  Has not had any measured fevers.  No congestion.    Past Medical History:  Diagnosis Date  . History of pre-eclampsia in prior pregnancy, currently pregnant   . Ovarian cyst    No family history on file. Past Surgical History:  Procedure Laterality Date  . CESAREAN SECTION  07/07/2007   Patient Active Problem List   Diagnosis Date Noted  . Unsure of LMP (last menstrual period) as reason for ultrasound scan 07/07/2018  . Chronic pelvic pain in female 02/11/2013      Prior to Admission medications   Medication Sig Start Date End Date Taking? Authorizing Provider  promethazine (PHENERGAN) 25 MG tablet Take 1 tablet (25 mg total) by mouth every 4 (four) hours as needed for nausea or vomiting. 06/21/18   Emily FilbertWilliams, Jonathan E, MD    Allergies Patient has no known allergies.    Social History Social History   Tobacco Use  . Smoking status: Current Every Day Smoker    Packs/day: 2.00    Types: Cigarettes  . Smokeless tobacco: Never Used  Substance Use Topics  . Alcohol use: No  . Drug use: No    Review of Systems Patient denies headaches, rhinorrhea, blurry vision, numbness, shortness of breath, chest pain, edema, cough, abdominal pain, nausea, vomiting, diarrhea, dysuria, fevers, rashes  or hallucinations unless otherwise stated above in HPI. ____________________________________________   PHYSICAL EXAM:  VITAL SIGNS: Vitals:   07/29/18 0725 07/29/18 0745  BP: 114/70   Pulse: 95 90  Resp: 16   Temp: 98.9 F (37.2 C)   SpO2: 100% 100%    Constitutional: Alert and oriented.  Eyes: Conjunctivae are normal.  Head: Atraumatic. Nose: No congestion/rhinnorhea. Mouth/Throat: Mucous membranes are moist.   Neck: No stridor. Painless ROM.  Cardiovascular: Normal rate, regular rhythm. Grossly normal heart sounds.  Good peripheral circulation. Respiratory: Normal respiratory effort.  No retractions. Lungs with faint end expiratory wheeze Gastrointestinal: Soft and nontender. No distention. No abdominal bruits. No CVA tenderness. Genitourinary:  Musculoskeletal: No lower extremity tenderness nor edema.  No joint effusions. Neurologic:  Normal speech and language. No gross focal neurologic deficits are appreciated. No facial droop Skin:  Skin is warm, dry and intact. No rash noted. Psychiatric: Mood and affect are normal. Speech and behavior are normal.  ____________________________________________   LABS (all labs ordered are listed, but only abnormal results are displayed)  Results for orders placed or performed during the hospital encounter of 07/29/18 (from the past 24 hour(s))  CBC with Differential/Platelet     Status: Abnormal   Collection Time: 07/29/18  7:43 AM  Result Value Ref Range   WBC 5.7 4.0 - 10.5 K/uL   RBC 4.38 3.87 - 5.11 MIL/uL   Hemoglobin 13.1 12.0 -  15.0 g/dL   HCT 51.7 61.6 - 07.3 %   MCV 88.8 80.0 - 100.0 fL   MCH 29.9 26.0 - 34.0 pg   MCHC 33.7 30.0 - 36.0 g/dL   RDW 71.0 (H) 62.6 - 94.8 %   Platelets 292 150 - 400 K/uL   nRBC 0.0 0.0 - 0.2 %   Neutrophils Relative % 70 %   Neutro Abs 4.0 1.7 - 7.7 K/uL   Lymphocytes Relative 19 %   Lymphs Abs 1.1 0.7 - 4.0 K/uL   Monocytes Relative 9 %   Monocytes Absolute 0.5 0.1 - 1.0 K/uL    Eosinophils Relative 1 %   Eosinophils Absolute 0.1 0.0 - 0.5 K/uL   Basophils Relative 1 %   Basophils Absolute 0.0 0.0 - 0.1 K/uL   Immature Granulocytes 0 %   Abs Immature Granulocytes 0.02 0.00 - 0.07 K/uL  Comprehensive metabolic panel     Status: Abnormal   Collection Time: 07/29/18  7:43 AM  Result Value Ref Range   Sodium 134 (L) 135 - 145 mmol/L   Potassium 4.6 3.5 - 5.1 mmol/L   Chloride 107 98 - 111 mmol/L   CO2 21 (L) 22 - 32 mmol/L   Glucose, Bld 96 70 - 99 mg/dL   BUN 8 6 - 20 mg/dL   Creatinine, Ser 5.46 0.44 - 1.00 mg/dL   Calcium 9.8 8.9 - 27.0 mg/dL   Total Protein 7.3 6.5 - 8.1 g/dL   Albumin 3.9 3.5 - 5.0 g/dL   AST 19 15 - 41 U/L   ALT 13 0 - 44 U/L   Alkaline Phosphatase 45 38 - 126 U/L   Total Bilirubin 0.8 0.3 - 1.2 mg/dL   GFR calc non Af Amer >60 >60 mL/min   GFR calc Af Amer >60 >60 mL/min   Anion gap 6 5 - 15  Troponin I - ONCE - STAT     Status: None   Collection Time: 07/29/18  7:43 AM  Result Value Ref Range   Troponin I <0.03 <0.03 ng/mL  Fibrin derivatives D-Dimer ([facility] only)     Status: Abnormal   Collection Time: 07/29/18  7:43 AM  Result Value Ref Range   Fibrin derivatives D-dimer ([facility]) 511.26 (H) 0.00 - 499.00 ng/mL (FEU)   ____________________________________________  EKG My review and personal interpretation at Time: 7:18   Indication: sob  Rate: 100  Rhythm: sinus Axis: normal Other: normal intervals, no stemi ____________________________________________  RADIOLOGY  I personally reviewed all radiographic images ordered to evaluate for the above acute complaints and reviewed radiology reports and findings.  These findings were personally discussed with the patient.  Please see medical record for radiology report.  ____________________________________________   PROCEDURES  Procedure(s) performed:  Procedures    Critical Care performed: no ____________________________________________   INITIAL IMPRESSION / ASSESSMENT  AND PLAN / ED COURSE  Pertinent labs & imaging results that were available during my care of the patient were reviewed by me and considered in my medical decision making (see chart for details).   DDX: bronchitis, pna, pleurisy, asthma, pe, msk strain  Jonathan Dangel is a 28 y.o. who presents to the ED with symptoms as described above.  Patient in no acute distress.  Does have occasional faint expiratory wheeze.  Given her pregnancy status will send blood work to evaluate for the above differential.  She is low risk by Wells criteria but if she is pregnant unable to [facility] therefore will send d-dimer to further  risk stratify.  Clinical Course as of Jul 29 1012  Tue Jul 29, 2018  0821 D-dimer is only marginally elevated.  Patient is feeling significant improvement after albuterol suggesting some component of bronchitis.  Patient reluctant to get x-ray imaging to fear of radiation.  I have encouraged her to get chest x-ray to exclude consolidation or pneumonia.  She agrees with plan.   [PR]  1011 Ultrasound negative.  At this point the patient symptoms have resolved after albuterol suggesting more of a component of bronchitis.  No evidence of pneumonia or consolidation.  Given low pretest probability with negative lower extremity ultrasound no hypoxia tachycardia or other respiratory symptoms do not feel this is consistent with PE.  However, I did discuss the elevated d-dimer and discussed using shared decision making option for CTA to further exclude PE.  Patient demonstrates understanding of the risks of delayed diagnosis including chronic respiratory illness and even death but also understands the risks associated with CT imaging and radiation exposure.  Patient would prefer to trial a period of observation which I think is reasonable particular given her well appearance.  Discussed signs and symptoms for which she should return to the ER.   [PR]    Clinical Course User Index [PR] Willy Eddyobinson, Angalena Cousineau, MD      As part of my medical decision making, I reviewed the following data within the electronic MEDICAL RECORD NUMBER Nursing notes reviewed and incorporated, Labs reviewed, notes from prior ED visits and [place] Controlled Substance Database   ____________________________________________   FINAL CLINICAL IMPRESSION(S) / ED DIAGNOSES  Final diagnoses:  Shortness of breath      NEW MEDICATIONS STARTED DURING THIS VISIT:  New Prescriptions   No medications on file     Note:  This document was prepared using Dragon voice recognition software and may include unintentional dictation errors.    Willy Eddyobinson, Makari Portman, MD 07/29/18 1015

## 2018-07-29 NOTE — ED Notes (Signed)
Patient transported to Ultrasound 

## 2018-07-29 NOTE — ED Notes (Signed)
Patient transported to X-ray 

## 2018-07-29 NOTE — ED Triage Notes (Signed)
Pt here with c/o SHOB that began yesterday, headache for 2 days on and off, states she is 3 months pregnant, had sharp left sided cp while at work, came home and ate "a spoonful of mustard," then took a nap and felt better, this shortness of breath this am. Has had a cough for 2 days as well. Pursed lip breathing, NAD.

## 2018-08-05 ENCOUNTER — Encounter: Payer: Self-pay | Admitting: Obstetrics and Gynecology

## 2018-08-05 ENCOUNTER — Ambulatory Visit (INDEPENDENT_AMBULATORY_CARE_PROVIDER_SITE_OTHER): Payer: BLUE CROSS/BLUE SHIELD | Admitting: Obstetrics and Gynecology

## 2018-08-05 VITALS — BP 112/76 | HR 87 | Ht 69.0 in | Wt 142.9 lb

## 2018-08-05 DIAGNOSIS — O26899 Other specified pregnancy related conditions, unspecified trimester: Secondary | ICD-10-CM

## 2018-08-05 DIAGNOSIS — R109 Unspecified abdominal pain: Secondary | ICD-10-CM

## 2018-08-05 LAB — POCT URINALYSIS DIPSTICK OB
Bilirubin, UA: NEGATIVE
Blood, UA: NEGATIVE
Glucose, UA: NEGATIVE
Ketones, UA: NEGATIVE
Leukocytes, UA: NEGATIVE
Nitrite, UA: NEGATIVE
POC,PROTEIN,UA: NEGATIVE
Spec Grav, UA: 1.01 (ref 1.010–1.025)
Urobilinogen, UA: 0.2 E.U./dL
pH, UA: 6 (ref 5.0–8.0)

## 2018-08-05 MED ORDER — PRENATE MINI 29-0.6-0.4-350 MG PO CAPS: ORAL_CAPSULE | ORAL | 10 refills | Status: DC

## 2018-08-05 NOTE — Addendum Note (Signed)
Addended by: Dorian Pod on: 08/05/2018 01:09 PM   Modules accepted: Orders

## 2018-08-05 NOTE — Progress Notes (Signed)
Patient come sin today for upper abdominal pain. Patient states that the abdominal pain comes and goes. The pain is sharp when she has. She also has some SOB.

## 2018-08-05 NOTE — Addendum Note (Signed)
Addended by: Dorian Pod on: 08/05/2018 11:55 AM   Modules accepted: Orders

## 2018-08-05 NOTE — Progress Notes (Signed)
ROB: Patient complains of upper abdominal pain especially right-sided.  She is not sure if it is related to eating.  Reports it as a stabbing burning pain.  Ultrasound of gallbladder ordered.

## 2018-08-08 ENCOUNTER — Ambulatory Visit: Payer: BLUE CROSS/BLUE SHIELD

## 2018-08-08 ENCOUNTER — Ambulatory Visit
Admission: RE | Admit: 2018-08-08 | Discharge: 2018-08-08 | Disposition: A | Discharge status: Home / Self Care | Payer: BLUE CROSS/BLUE SHIELD | Source: Ambulatory Visit | Attending: Obstetrics and Gynecology | Admitting: Obstetrics and Gynecology

## 2018-08-08 DIAGNOSIS — O26899 Other specified pregnancy related conditions, unspecified trimester: Secondary | ICD-10-CM | POA: Diagnosis not present

## 2018-08-08 DIAGNOSIS — R109 Unspecified abdominal pain: Secondary | ICD-10-CM | POA: Diagnosis not present

## 2018-08-19 ENCOUNTER — Ambulatory Visit (INDEPENDENT_AMBULATORY_CARE_PROVIDER_SITE_OTHER): Payer: BLUE CROSS/BLUE SHIELD | Admitting: Obstetrics and Gynecology

## 2018-08-19 ENCOUNTER — Encounter: Payer: Self-pay | Admitting: Obstetrics and Gynecology

## 2018-08-19 VITALS — BP 124/70 | HR 91 | Wt 151.0 lb

## 2018-08-19 DIAGNOSIS — Z23 Encounter for immunization: Secondary | ICD-10-CM

## 2018-08-19 DIAGNOSIS — Z3482 Encounter for supervision of other normal pregnancy, second trimester: Secondary | ICD-10-CM

## 2018-08-19 DIAGNOSIS — O09292 Supervision of pregnancy with other poor reproductive or obstetric history, second trimester: Secondary | ICD-10-CM

## 2018-08-19 DIAGNOSIS — O34219 Maternal care for unspecified type scar from previous cesarean delivery: Secondary | ICD-10-CM

## 2018-08-19 DIAGNOSIS — O09299 Supervision of pregnancy with other poor reproductive or obstetric history, unspecified trimester: Secondary | ICD-10-CM | POA: Insufficient documentation

## 2018-08-19 DIAGNOSIS — Z1379 Encounter for other screening for genetic and chromosomal anomalies: Secondary | ICD-10-CM | POA: Diagnosis not present

## 2018-08-19 DIAGNOSIS — Z98891 History of uterine scar from previous surgery: Secondary | ICD-10-CM

## 2018-08-19 LAB — POCT URINALYSIS DIPSTICK OB
Bilirubin, UA: NEGATIVE
Blood, UA: NEGATIVE
Glucose, UA: NEGATIVE
Ketones, UA: NEGATIVE
LEUKOCYTES UA: NEGATIVE
Nitrite, UA: NEGATIVE
POC,PROTEIN,UA: NEGATIVE
Spec Grav, UA: >=1.03 — AB (ref 1.010–1.025)
Urobilinogen, UA: 0.2 E.U./dL
pH, UA: 6 (ref 5.0–8.0)

## 2018-08-19 MED ORDER — ASPIRIN EC 81 MG PO TBEC: 81.0000 mg | DELAYED_RELEASE_TABLET | Freq: Every day | ORAL | 2 refills | Status: DC

## 2018-08-19 NOTE — Progress Notes (Signed)
ROB-Pt present today for prenatal care. Pt stated that she is having issues at working standing up causes back pain, feet pain/swelling, pt is requesting to work only 8 hours a day instead of hours.

## 2018-08-19 NOTE — Patient Instructions (Addendum)
Begin taking baby aspirin (81 mg) daily.    Second Trimester of Pregnancy  The second trimester is from week 14 through week 27 (month 4 through 6). This is often the time in pregnancy that you feel your best. Often times, morning sickness has lessened or quit. You may have more energy, and you may get hungry more often. Your unborn baby is growing rapidly. At the end of the sixth month, he or she is about 9 inches long and weighs about 1 pounds. You will likely feel the baby move between 18 and 20 weeks of pregnancy. Follow these instructions at home: Medicines  Take over-the-counter and prescription medicines only as told by your doctor. Some medicines are safe and some medicines are not safe during pregnancy.  Take a prenatal vitamin that contains at least 600 micrograms (mcg) of folic acid.  If you have trouble pooping (constipation), take medicine that will make your stool soft (stool softener) if your doctor approves. Eating and drinking   Eat regular, healthy meals.  Avoid raw meat and uncooked cheese.  If you get low calcium from the food you eat, talk to your doctor about taking a daily calcium supplement.  Avoid foods that are high in fat and sugars, such as fried and sweet foods.  If you feel sick to your stomach (nauseous) or throw up (vomit): ? Eat 4 or 5 small meals a day instead of 3 large meals. ? Try eating a few soda crackers. ? Drink liquids between meals instead of during meals.  To prevent constipation: ? Eat foods that are high in fiber, like fresh fruits and vegetables, whole grains, and beans. ? Drink enough fluids to keep your pee (urine) clear or pale yellow. Activity  Exercise only as told by your doctor. Stop exercising if you start to have cramps.  Do not exercise if it is too hot, too humid, or if you are in a place of great height (high altitude).  Avoid heavy lifting.  Wear low-heeled shoes. Sit and stand up straight.  You can continue to  have sex unless your doctor tells you not to. Relieving pain and discomfort  Wear a good support bra if your breasts are tender.  Take warm water baths (sitz baths) to soothe pain or discomfort caused by hemorrhoids. Use hemorrhoid cream if your doctor approves.  Rest with your legs raised if you have leg cramps or low back pain.  If you develop puffy, bulging veins (varicose veins) in your legs: ? Wear support hose or compression stockings as told by your doctor. ? Raise (elevate) your feet for 15 minutes, 3-4 times a day. ? Limit salt in your food. Prenatal care  Write down your questions. Take them to your prenatal visits.  Keep all your prenatal visits as told by your doctor. This is important. Safety  Wear your seat belt when driving.  Make a list of emergency phone numbers, including numbers for family, friends, the hospital, and police and fire departments. General instructions  Ask your doctor about the right foods to eat or for help finding a counselor, if you need these services.  Ask your doctor about local prenatal classes. Begin classes before month 6 of your pregnancy.  Do not use hot tubs, steam rooms, or saunas.  Do not douche or use tampons or scented sanitary pads.  Do not cross your legs for long periods of time.  Visit your dentist if you have not done so. Use a soft toothbrush to  brush your teeth. Floss gently.  Avoid all smoking, herbs, and alcohol. Avoid drugs that are not approved by your doctor.  Do not use any products that contain nicotine or tobacco, such as cigarettes and e-cigarettes. If you need help quitting, ask your doctor.  Avoid cat litter boxes and soil used by cats. These carry germs that can cause birth defects in the baby and can cause a loss of your baby (miscarriage) or stillbirth. Contact a doctor if:  You have mild cramps or pressure in your lower belly.  You have pain when you pee (urinate).  You have bad smelling fluid  coming from your vagina.  You continue to feel sick to your stomach (nauseous), throw up (vomit), or have watery poop (diarrhea).  You have a nagging pain in your belly area.  You feel dizzy. Get help right away if:  You have a fever.  You are leaking fluid from your vagina.  You have spotting or bleeding from your vagina.  You have severe belly cramping or pain.  You lose or gain weight rapidly.  You have trouble catching your breath and have chest pain.  You notice sudden or extreme puffiness (swelling) of your face, hands, ankles, feet, or legs.  You have not felt the baby move in over an hour.  You have severe headaches that do not go away when you take medicine.  You have trouble seeing. Summary  The second trimester is from week 14 through week 27 (months 4 through 6). This is often the time in pregnancy that you feel your best.  To take care of yourself and your unborn baby, you will need to eat healthy meals, take medicines only if your doctor tells you to do so, and do activities that are safe for you and your baby.  Call your doctor if you get sick or if you notice anything unusual about your pregnancy. Also, call your doctor if you need help with the right food to eat, or if you want to know what activities are safe for you.     Vaginal Birth After Cesarean Delivery  Vaginal birth after cesarean delivery (VBAC) is giving birth vaginally after previously delivering a baby through a cesarean section (C-section). A VBAC may be a safe option for you, depending on your health and other factors. It is important to discuss VBAC with your health care provider early in your pregnancy so you can understand the risks, benefits, and options. Having these discussions early will give you time to make your birth plan. Who are the best candidates for VBAC? The best candidates for VBAC are women who:  Have had one or two prior cesarean deliveries, and the incision made during  the delivery was horizontal (low transverse).  Do not have a vertical (classical) scar on their uterus.  Have not had a tear in the wall of their uterus (uterine rupture).  Plan to have more pregnancies. A VBAC is also more likely to be successful:  In women who have previously given birth vaginally.  When labor starts by itself (spontaneously) before the due date. What are the benefits of VBAC? The benefits of delivering your baby vaginally instead of by a cesarean delivery include:  A shorter hospital stay.  A faster recovery time.  Less pain.  Avoiding risks associated with major surgery, such as infection and blood clots.  Less blood loss and less need for donated blood (transfusions). What are the risks of VBAC? The main risk of attempting  a VBAC is that it may fail, forcing your health care provider to deliver your baby by a C-section. Other risks are rare and include:  Tearing (rupture) of the scar from a past cesarean delivery.  Other risks associated with vaginal deliveries. If a repeat cesarean delivery is needed, the risks include:  Blood loss.  Infection.  Blood clot.  Damage to surrounding organs.  Removal of the uterus (hysterectomy), if it is damaged.  Placenta problems in future pregnancies. What else should I know about my options? Delivering a baby through a VBAC is similar to having a normal spontaneous vaginal delivery. Therefore, it is safe:  To try with twins.  For your health care provider to try to turn the baby from a breech position (external cephalic version) during labor.  With epidural analgesia for pain relief. Consider where you would like to deliver your baby. VBAC should be attempted in facilities where an emergency cesarean delivery can be performed. VBAC is not recommended for home births. Any changes in your health or your baby's health during your pregnancy may make it necessary to change your initial decision about VBAC. Your  health care provider may recommend that you do not attempt a VBAC if:  Your baby's suspected weight is 8.8 lb (4 kg) or more.  You have preeclampsia. This is a condition that causes high blood pressure along with other symptoms, such as swelling and headaches.  You will have VBAC less than 19 months after your cesarean delivery.  You are past your due date.  You need to have labor started (induced) because your cervix is not ready for labor (unfavorable). Where to find more information  American Pregnancy Association: americanpregnancy.org  [employer]: acog.org Summary  Vaginal birth after cesarean delivery (VBAC) is giving birth vaginally after previously delivering a baby through a cesarean section (C-section). A VBAC may be a safe option for you, depending on your health and other factors.  Discuss VBAC with your health care provider early in your pregnancy so you can understand the risks, benefits, options, and have plenty of time to make your birth plan.  The main risk of attempting a VBAC is that it may fail, forcing your health care provider to deliver your baby by a C-section. Other risks are rare. This information is not intended to replace advice given to you by your health care provider. Make sure you discuss any questions you have with your health care provider. Document Released: 11/25/2006 Document Revised: 09/11/2016 Document Reviewed: 09/11/2016 Elsevier Interactive Patient Education  [employer].  This information is not intended to replace advice given to you by your health care provider. Make sure you discuss any questions you have with your health care provider. Document Released: 08/29/2009 Document Revised: 07/10/2016 Document Reviewed: 07/10/2016 Elsevier Interactive Patient Education  2019 Else[employer] Inc.   Influenza (Flu) Vaccine (Inactivated or Recombinant): What You Need to Know 1. Why get  vaccinated? Influenza vaccine can prevent influenza (flu). Flu is a contagious disease that spreads around the [place] every year, usually between October and May. Anyone can get the flu, but it is more dangerous for some people. Infants and young children, people 34 years of age and older, pregnant women, and people with certain health conditions or a weakened immune system are at greatest risk of flu complications. Pneumonia, bronchitis, sinus infections and ear infections are examples of flu-related complications. If you have a medical condition, such as heart disease, cancer or diabetes,  flu can make it worse. Flu can cause fever and chills, sore throat, muscle aches, fatigue, cough, headache, and runny or stuffy nose. Some people may have vomiting and diarrhea, though this is more common in children than adults. Each year thousands of people in the [place] die from flu, and many more are hospitalized. Flu vaccine prevents millions of illnesses and flu-related visits to the doctor each year. 2. Influenza vaccine CDC recommends everyone 57 months of age and older get vaccinated every flu season. Children 6 months through 42 years of age may need 2 doses during a single flu season. Everyone else needs only 1 dose each flu season. It takes about 2 weeks for protection to develop after vaccination. There are many flu viruses, and they are always changing. Each year a new flu vaccine is made to protect against three or four viruses that are likely to cause disease in the upcoming flu season. Even when the vaccine doesn't exactly match these viruses, it may still provide some protection. Influenza vaccine does not cause flu. Influenza vaccine may be given at the same time as other vaccines. 3. Talk with your health care provider Tell your vaccine provider if the person getting the vaccine: Has had an allergic reaction after a previous dose of influenza vaccine, or has any severe,  life-threatening allergies. Has ever had Guillain-Barr Syndrome (also called GBS). In some cases, your health care provider may decide to postpone influenza vaccination to a future visit. People with minor illnesses, such as a cold, may be vaccinated. People who are moderately or severely ill should usually wait until they recover before getting influenza vaccine. Your health care provider can give you more information. 4. Risks of a vaccine reaction Soreness, redness, and swelling where shot is given, fever, muscle aches, and headache can happen after influenza vaccine. There may be a very small increased risk of Guillain-Barr Syndrome (GBS) after inactivated influenza vaccine (the flu shot). Young children who get the flu shot along with pneumococcal vaccine (PCV13), and/or DTaP vaccine at the same time might be slightly more likely to have a seizure caused by fever. Tell your health care provider if a child who is getting flu vaccine has ever had a seizure. People sometimes faint after medical procedures, including vaccination. Tell your provider if you feel dizzy or have vision changes or ringing in the ears. As with any medicine, there is a very remote chance of a vaccine causing a severe allergic reaction, other serious injury, or death. 5. What if there is a serious problem? An allergic reaction could occur after the vaccinated person leaves the clinic. If you see signs of a severe allergic reaction (hives, swelling of the face and throat, difficulty breathing, a fast heartbeat, dizziness, or weakness), call 9-1-1 and get the person to the nearest hospital. For other signs that concern you, call your health care provider. Adverse reactions should be reported to the Vaccine Adverse Event Reporting System (VAERS). Your health care provider will usually file this report, or you can do it yourself. Visit the VAERS website at www.vaers.LAgents.no or call 647-537-8321.VAERS is only for reporting  reactions, and VAERS staff do not give medical advice. 6. The National Vaccine Injury Compensation Program The [employer] Vaccine Injury Compensation Program (VICP) is a federal program that was created to compensate people who may have been injured by certain vaccines. Visit the VICP website at SpiritualWord.at or call 615-109-4347 to learn about the program and about filing a claim. There is a  time limit to file a claim for compensation. 7. How can I learn more? Ask your healthcare provider. Call your local or state health department. Contact the Centers for Disease Control and Prevention (CDC): Call 7857034214 (1-800-CDC-INFO) or Visit CDC's BiotechRoom.com.cy Vaccine Information Statement (Interim) Inactivated Influenza Vaccine (01/30/2018) This information is not intended to replace advice given to you by your health care provider. Make sure you discuss any questions you have with your health care provider. Document Released: 03/29/2006 Document Revised: 02/03/2018 Document Reviewed: 02/03/2018 Elsevier Interactive Patient Education  2019 [employer].

## 2018-08-19 NOTE — Progress Notes (Signed)
ROB: Patient notes issues with her job duties and hours now that she is pregnant.  She works at [employer], with constant standing, which causes back pain and feet swelling. Desires letter to modify hours and duties (desires to work only 8 hrs/week instead of 10, and not working on Saturdays). Letter provided (which also included approved exercises that she can do on the job). She denies initiating aspirin as was instructed last visit as she forgot.  Strongly encouraged to begin, sent prescription to pharmacy.  Discussed again desires for TOLAC vs repeat C-section, patient notes she would like to [facility]. Given handout, discussion on risks/benefits.  For flu vaccine today. For genetic testing today (MaterniT21 and AFP ordered). RTC in 4 weeks  For anatomy scan at that time.

## 2018-08-21 LAB — AFP, SERUM, OPEN SPINA BIFIDA
AFP MoM: 1.04
AFP Value: 39.1 ng/mL
Gest. Age on Collection Date: 16.3 weeks
Maternal Age At EDD: 29.3 yr
OSBR Risk 1 IN: 10000
Test Results:: NEGATIVE
Weight: 151 [lb_av]

## 2018-08-24 DIAGNOSIS — S01521A Laceration with foreign body of lip, initial encounter: Secondary | ICD-10-CM | POA: Diagnosis not present

## 2018-08-24 DIAGNOSIS — O9989 Other specified diseases and conditions complicating pregnancy, childbirth and the puerperium: Secondary | ICD-10-CM | POA: Diagnosis not present

## 2018-08-24 DIAGNOSIS — Z3A16 16 weeks gestation of pregnancy: Secondary | ICD-10-CM | POA: Diagnosis not present

## 2018-08-24 DIAGNOSIS — O9A212 Injury, poisoning and certain other consequences of external causes complicating pregnancy, second trimester: Secondary | ICD-10-CM | POA: Diagnosis not present

## 2018-08-24 DIAGNOSIS — R220 Localized swelling, mass and lump, head: Secondary | ICD-10-CM | POA: Diagnosis not present

## 2018-08-24 DIAGNOSIS — S00551A Superficial foreign body of lip, initial encounter: Secondary | ICD-10-CM | POA: Diagnosis not present

## 2018-08-24 DIAGNOSIS — Z6822 Body mass index (BMI) 22.0-22.9, adult: Secondary | ICD-10-CM | POA: Diagnosis not present

## 2018-08-24 LAB — MATERNIT21  PLUS CORE+ESS+SCA, BLOOD
Chromosome 13: NEGATIVE
Chromosome 18: NEGATIVE
Chromosome 21: NEGATIVE
Fetal Fraction: 9
Y Chromosome: DETECTED

## 2018-08-25 ENCOUNTER — Other Ambulatory Visit: Payer: Self-pay

## 2018-08-25 ENCOUNTER — Encounter: Payer: Self-pay | Admitting: Emergency Medicine

## 2018-08-25 ENCOUNTER — Emergency Department
Admission: EM | Admit: 2018-08-25 | Discharge: 2018-08-25 | Disposition: A | Discharge status: Home / Self Care | Payer: BLUE CROSS/BLUE SHIELD | Attending: Emergency Medicine | Admitting: Emergency Medicine

## 2018-08-25 DIAGNOSIS — R52 Pain, unspecified: Secondary | ICD-10-CM | POA: Diagnosis not present

## 2018-08-25 DIAGNOSIS — O9989 Other specified diseases and conditions complicating pregnancy, childbirth and the puerperium: Secondary | ICD-10-CM | POA: Insufficient documentation

## 2018-08-25 DIAGNOSIS — R1084 Generalized abdominal pain: Secondary | ICD-10-CM | POA: Diagnosis not present

## 2018-08-25 DIAGNOSIS — Y999 Unspecified external cause status: Secondary | ICD-10-CM | POA: Diagnosis not present

## 2018-08-25 DIAGNOSIS — O99332 Smoking (tobacco) complicating pregnancy, second trimester: Secondary | ICD-10-CM | POA: Diagnosis not present

## 2018-08-25 DIAGNOSIS — Z3A16 16 weeks gestation of pregnancy: Secondary | ICD-10-CM | POA: Insufficient documentation

## 2018-08-25 DIAGNOSIS — R109 Unspecified abdominal pain: Secondary | ICD-10-CM | POA: Insufficient documentation

## 2018-08-25 DIAGNOSIS — Y939 Activity, unspecified: Secondary | ICD-10-CM | POA: Diagnosis not present

## 2018-08-25 DIAGNOSIS — Z7982 Long term (current) use of aspirin: Secondary | ICD-10-CM | POA: Insufficient documentation

## 2018-08-25 DIAGNOSIS — F17210 Nicotine dependence, cigarettes, uncomplicated: Secondary | ICD-10-CM | POA: Insufficient documentation

## 2018-08-25 LAB — HCG, QUANTITATIVE, PREGNANCY: hCG, Beta Chain, Quant, S: 19442 m[IU]/mL — ABNORMAL HIGH (ref <5)

## 2018-08-25 NOTE — Discharge Instructions (Signed)
Please seek medical attention for any high fevers, chest pain, shortness of breath, change in behavior, persistent vomiting, bloody stool or any other new or concerning symptoms.  
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
## 2018-09-09 ENCOUNTER — Encounter: Payer: Self-pay | Admitting: Surgical

## 2018-09-16 ENCOUNTER — Telehealth: Payer: Self-pay

## 2018-09-16 NOTE — Telephone Encounter (Signed)
Pt called no answer LM via voicemail to call the office to go over COVID-19 questions and visitor policy before her visit tomorrow.

## 2018-09-16 NOTE — Progress Notes (Addendum)
ROB-Pt present today for prenatal care and 20 week u/s. Pt stated that she was doing well. Pt stated fetal movement, no      pressure or pain and contractions or vaginal bleeding.

## 2018-09-16 NOTE — Patient Instructions (Signed)
FREQUENTLY ASKED QUESTIONS FOR OBSTETRICS/PEDIATRICS    Q: Why are visitor restrictions different for maternity care areas?  [place] is restricting visitors for the duration of the patient's hospitalization. The birth of a child involves the mother, considered the patient, and a birthing partner. These are unprecedented times and we are making the exception to allow a birthing partner to be a part of the patient unit. No other guests will be allowed in our [place] [facility] and at [facility].   Q: Are credentialed doulas allowed to support their existing patients?  We acknowledge the value these doula partnerships offer our care teams and many birthing families in our communities. Each laboring mother is allowed one birthing partner of the patient's choosing for her entire hospitalization.   Q: Are visitor restrictions different for hospitalized children?  Pediatric patients (infants and children under 42 years of age), such as those in the Children's Unit, Pediatric ICU and NICU, will be allowed two visitors (parents or legal guardians)   Q: Are pregnant women at an increased risk for COVID-19?  The [employer] of Obstetricians and Gynecologists (ACOG) is monitoring closely the coronavirus pandemic. With the limited information available, data does not indicate pregnant women are at an increased risk. However, pregnant women are known to be at greater risk for respiratory infections like flu. With that in mind, expectant mothers are considered an at-risk population for COVID-19, according to ACOG.   Q: Are newborns at an increased risk for COVID-19?  A limited sample of COVID-19 data with newborns indicates the virus is not transferred to the infant during pregnancy. However, postpartum separation is recommended by the Centers for Disease Control (CDC). As a result [place] recommends and strongly encourages  temporary separation of moms and babies who test positive for COVID-19 or are awaiting results to rule out COVID-19 based on CDC guidelines.   Q: If you have a suspected case of COVID-19, is the NICU couplet care room an option?  No. If either patient is considered at-risk for having COVID-19, the [place] [facility] will not use the NICU couplet care rooms for that family.   Q: [place] is urging that elective procedures be postponed. What is considered elective for women's and children's service line?  NOT ELECTIVE: Obstetric procedures, even those with an element of choice on timing, are not considered elective. Circumcisions are considered elective procedures, however, these do not deplete blood products and other resources, which is the spirit in which the COVID-19 postponement of elective procedures was intended. Therefore, circumcisions will be allowed.   ELECTIVE: Postpartum tubal ligations are considered elective and should be postponed. Q&A for Obstetricians, Gynecologists and Pediatricians  Published September 05, 2018   [facility] supports as much as possible the medical care team working with the patient's individual needs to address timing during these unprecedented times. We seek the support of our medical care team in preserving needed resources throughout our crisis response to COVID-19.   Q: How does COVID-19 impact breastfeeding?  Breastmilk is safe for your baby - even if the mother has tested positive for COVID-19. If a COVID-19+ mother decides to breastfeed while inpatient and after discharge, we suggest proper protective equipment be worn and hand hygiene be performed  before and after feeding the infant. The new mother also has the option to pump her milk and have a healthy family member feed the baby to protect the baby from getting the virus.   Q: Should we urge patients to avoid baby showers and large gatherings?  Yes. As has been recommended  for all citizens in our communities, gatherings of 10 or more should be avoided - pregnant or not. Seek creative options for "hosting" baby showers through electronic means that honor the request for social distancing during this time of heightened awareness.   Q: Should patients miss their prenatal appointments?  No. Prenatal visits are NOT elective. While we want to limit contact and exposure, prenatal care is vital right now. Contact your physician's office if you have concerns about your visits. We are limiting outpatient office visits to the patient and one guest in order to reduce the potential for exposure.   Q: What if a pregnant woman feels sick? Should she miss her prenatal visit then?  A pregnant woman experiencing coronavirus-like symptoms (i.e., cough, fever, difficulty breathing, shortness of breath, gastrointestinal issues) should contact her pregnancy care provider by phone. Her medical professional can best determine whether she should use a video visit or possibly go to a collection site to be tested for COVID-19. Contacting her primary care provider or her pregnancy care provider is her first step.   Q: What can I do about childbirth education? All the classes are cancelled.  The [facility] Centers will offer online learning to support mothers on their journey. We currently offer Understanding Childbirth, Understanding Breastfeeding and Understanding Newborn Care as an online class. Please visit our website, [email or web address], to register for an online class.   Q: How can I keep from getting COVID-19? Q&A for Obstetricians, Gynecologists and Pediatricians  Published September 05, 2018   Together, we can reduce the risk of exposure to the virus and help you and your family remain healthy and safe. One of the best ways to protect yourself is to wash your hands frequently using soap and water. Also, you should avoid touching your eyes, nose and mouth with unwashed  hands, avoid physical contact with others and practice social distancing.   Q: How are employees being informed about what to do?  [place] leaders receive a daily COVID-19 update and share relevant information with their teams. This is a time when health care professionals are called on to lead within our community. We appreciate our staff's engagement with our COVID-19 updates and encourage them to share best practices on reducing the spread of the virus with our patients and community. We are prepared to provide the exceptional COVID-19 care and coordination our community needs, expects and deserves.   Q: Who's in charge of this issue at [facility]?  The leadership structure and process established to address COVID-19 includes Chief Physician Executive Birdie Sons, MD; Infection Prevention Medical Director Judyann Munson, MD; and Infection Prevention Interim Director Jason Fila, MSN, RN, CIC, CSPDT. A team of [place] experts reflecting a broad spectrum of our workforce is meeting daily to evaluate new information we receive about COVID-19 and to adapt policies and practices accordingly.                         Published September 05, 2018    Obstetric patients have recently been named to the list of individuals who are considered a "high risk" group.  This is likely  due to the decreased immune function that pregnant women have and thus are more susceptible to infection.The [employer] of Obstetricians and Gynecologists (ACOG) is monitoring closely the coronavirus pandemic. With the limited information available, data does not indicate pregnant women are at an increased risk. However, pregnant women are known to be at greater risk for respiratory infections like flu. With that in mind, expectant mothers are considered an at-risk population for COVID-19, according to ACOG.  The typical precautions that are being advised apply to you.   Social distancing-stay home as much as possible and  limit contact with others. Especially avoid groups of people.   Wash your hands often with soap and water.  If soap and water are not readily available using alcohol based hand sanitizer. If coughing and sneezing, cover your mouth and nose when coughing or sneezing. Avoid shaking hands with others.  Avoid touching your eyes nose or mouth with unwashed hands. If you develop upper respiratory type symptoms especially with fever seek medical attention.  If you have some symptoms, but not all continue to monitor at home and if symptoms worsen then seek medical attention.  Please remember, COVID-19 is not thought to be transmissible across the placenta-so that while your baby is in utero he/she will not get infected even if you have the virus.

## 2018-09-17 ENCOUNTER — Other Ambulatory Visit: Payer: Self-pay

## 2018-09-17 ENCOUNTER — Ambulatory Visit (INDEPENDENT_AMBULATORY_CARE_PROVIDER_SITE_OTHER): Payer: BLUE CROSS/BLUE SHIELD | Admitting: Obstetrics and Gynecology

## 2018-09-17 ENCOUNTER — Ambulatory Visit (INDEPENDENT_AMBULATORY_CARE_PROVIDER_SITE_OTHER): Payer: BLUE CROSS/BLUE SHIELD

## 2018-09-17 ENCOUNTER — Encounter: Payer: Self-pay | Admitting: Obstetrics and Gynecology

## 2018-09-17 VITALS — BP 112/67 | HR 88 | Wt 149.0 lb

## 2018-09-17 DIAGNOSIS — Z98891 History of uterine scar from previous surgery: Secondary | ICD-10-CM

## 2018-09-17 DIAGNOSIS — Z130 Encounter for screening for diseases of the blood and blood-forming organs and certain disorders involving the immune mechanism: Secondary | ICD-10-CM

## 2018-09-17 DIAGNOSIS — Z3482 Encounter for supervision of other normal pregnancy, second trimester: Secondary | ICD-10-CM

## 2018-09-17 DIAGNOSIS — Z131 Encounter for screening for diabetes mellitus: Secondary | ICD-10-CM

## 2018-09-17 DIAGNOSIS — O34219 Maternal care for unspecified type scar from previous cesarean delivery: Secondary | ICD-10-CM

## 2018-09-17 LAB — POCT URINALYSIS DIPSTICK OB
Bilirubin, UA: NEGATIVE
Blood, UA: NEGATIVE
Glucose, UA: NEGATIVE
Ketones, UA: NEGATIVE
Leukocytes, UA: NEGATIVE
Nitrite, UA: NEGATIVE
Spec Grav, UA: 1.025 (ref 1.010–1.025)
Urobilinogen, UA: 0.2 E.U./dL
pH, UA: 6 (ref 5.0–8.0)

## 2018-09-17 NOTE — Progress Notes (Signed)
ROB: Doing well, no complaints. Normal anatomy scan, normal AFP.  Discussed Covid in pregnancy. RTC in 6 weeks, for glucola at that time.

## 2018-09-17 NOTE — Addendum Note (Signed)
Addended by: Silvano Bilis on: 09/17/2018 04:48 PM   Modules accepted: Kipp Brood

## 2018-09-18 ENCOUNTER — Encounter: Payer: Self-pay | Admitting: Obstetrics and Gynecology

## 2018-09-25 ENCOUNTER — Other Ambulatory Visit: Payer: Self-pay

## 2018-09-25 MED ORDER — PRENATAL GUMMIES/DHA & FA 0.4-32.5 MG PO CHEW: 0.4000 mg | CHEWABLE_TABLET | Freq: Every day | ORAL | 11 refills | Status: DC

## 2018-10-13 ENCOUNTER — Telehealth: Payer: Self-pay | Admitting: Obstetrics and Gynecology

## 2018-10-13 MED ORDER — PROMETHAZINE HCL 25 MG PO TABS: 25.0000 mg | ORAL_TABLET | Freq: Four times a day (QID) | ORAL | 1 refills | Status: DC | PRN

## 2018-10-13 NOTE — Telephone Encounter (Signed)
Spoke with pt and she stated that she has been really sick and vomiting. Pt stated that she was not having a fever, cough or shortness of breath. Sent in phenergan 25mg  for pt's nausea and vomiting. Pt was advised that if she did not notice any decrease in symptoms from the phenergan to please call the office to speak with a nurse or make an appointment to be seen by a provider.

## 2018-10-13 NOTE — Telephone Encounter (Signed)
Patient called stating she has been vomiting for 2 days and nothing is helping. Please Advise.

## 2018-10-29 ENCOUNTER — Encounter: Payer: BLUE CROSS/BLUE SHIELD | Admitting: Obstetrics and Gynecology

## 2018-10-29 ENCOUNTER — Other Ambulatory Visit: Payer: BLUE CROSS/BLUE SHIELD

## 2018-10-29 ENCOUNTER — Telehealth: Payer: Self-pay

## 2018-10-29 NOTE — Telephone Encounter (Signed)
Coronavirus (COVID-19) Are you at risk?  Are you at risk for the Coronavirus (COVID-19)?  To be considered HIGH RISK for Coronavirus (COVID-19), you have to meet the following criteria:  . Traveled to [place], [place], [place], [place] or [place]; or in the [place] to [place], [place], [place], or [place]; and have fever, cough, and shortness of breath within the last 2 weeks of travel OR . Been in close contact with a person diagnosed with COVID-19 within the last 2 weeks and have fever, cough, and shortness of breath . IF YOU DO NOT MEET THESE CRITERIA, YOU ARE CONSIDERED LOW RISK FOR COVID-19.  What to do if you are HIGH RISK for COVID-19?  . If you are having a medical emergency, call 911. . Seek medical care right away. Before you go to a doctor's office, urgent care or emergency department, call ahead and tell them about your recent travel, contact with someone diagnosed with COVID-19, and your symptoms. You should receive instructions from your physician's office regarding next steps of care.  . When you arrive at healthcare provider, tell the healthcare staff immediately you have returned from visiting [place], [place], [place], [place] or [place]; or traveled in the [place] to [place], [place], [place], or [place]; in the last two weeks or you have been in close contact with a person diagnosed with COVID-19 in the last 2 weeks.   . Tell the health care staff about your symptoms: fever, cough and shortness of breath. . After you have been seen by a medical provider, you will be either: o Tested for (COVID-19) and discharged home on quarantine except to seek medical care if symptoms worsen, and asked to  - Stay home and avoid contact with others until you get your results (4-5 days)  - Avoid travel on public transportation if possible (such as bus, train, or airplane) or o Sent to the Emergency Department by EMS for evaluation, COVID-19 testing, and possible  admission depending on your condition and test results.  What to do if you are LOW RISK for COVID-19?  Reduce your risk of any infection by using the same precautions used for avoiding the common cold or flu:  . Wash your hands often with soap and warm water for at least 20 seconds.  If soap and water are not readily available, use an alcohol-based hand sanitizer with at least 60% alcohol.  . If coughing or sneezing, cover your mouth and nose by coughing or sneezing into the elbow areas of your shirt or coat, into a tissue or into your sleeve (not your hands). . Avoid shaking hands with others and consider head nods or verbal greetings only. . Avoid touching your eyes, nose, or mouth with unwashed hands.  . Avoid close contact with people who are sick. . Avoid places or events with large numbers of people in one location, like concerts or sporting events. . Carefully consider travel plans you have or are making. . If you are planning any travel outside or inside the US, visit the CDC's Travelers' Health webpage for the latest health notices. . If you have some symptoms but not all symptoms, continue to monitor at home and seek medical attention if your symptoms worsen. . If you are having a medical emergency, call 911.   ADDITIONAL HEALTHCARE OPTIONS FOR PATIENTS  [facility] / e-Visit: [email or web address]         [facility]: [phone]  [facility]: [phone]                   [facility]: [phone]   Prescreened. Neg .cm 

## 2018-10-30 ENCOUNTER — Encounter: Payer: Self-pay | Admitting: Obstetrics and Gynecology

## 2018-10-30 ENCOUNTER — Other Ambulatory Visit: Payer: Self-pay

## 2018-10-30 ENCOUNTER — Ambulatory Visit (INDEPENDENT_AMBULATORY_CARE_PROVIDER_SITE_OTHER): Payer: BLUE CROSS/BLUE SHIELD | Admitting: Obstetrics and Gynecology

## 2018-10-30 ENCOUNTER — Other Ambulatory Visit: Payer: BLUE CROSS/BLUE SHIELD

## 2018-10-30 VITALS — BP 108/74 | HR 97 | Ht 69.0 in | Wt 155.3 lb

## 2018-10-30 DIAGNOSIS — Z130 Encounter for screening for diseases of the blood and blood-forming organs and certain disorders involving the immune mechanism: Secondary | ICD-10-CM | POA: Diagnosis not present

## 2018-10-30 DIAGNOSIS — Z131 Encounter for screening for diabetes mellitus: Secondary | ICD-10-CM | POA: Diagnosis not present

## 2018-10-30 DIAGNOSIS — Z3482 Encounter for supervision of other normal pregnancy, second trimester: Secondary | ICD-10-CM

## 2018-10-30 MED ORDER — PRENATE MINI 29-0.6-0.4-350 MG PO CAPS: ORAL_CAPSULE | ORAL | 10 refills | Status: DC

## 2018-10-30 NOTE — Progress Notes (Signed)
ROB: Doing well.  Taking aspirin as directed.  1 hour GCT today.

## 2018-10-30 NOTE — Addendum Note (Signed)
Addended by: Dorian Pod on: 10/30/2018 10:46 AM   Modules accepted: Orders

## 2018-10-30 NOTE — Progress Notes (Signed)
Patient comes in today for ROB visit. She states that the baby is sitting low and wants to know if this is OK.

## 2018-10-31 LAB — CBC
Hematocrit: 34.5 % (ref 34.0–46.6)
Hemoglobin: 11.9 g/dL (ref 11.1–15.9)
MCH: 31.6 pg (ref 26.6–33.0)
MCHC: 34.5 g/dL (ref 31.5–35.7)
MCV: 92 fL (ref 79–97)
Platelets: 276 10*3/uL (ref 150–450)
RBC: 3.76 x10E6/uL — ABNORMAL LOW (ref 3.77–5.28)
RDW: 13.6 % (ref 11.7–15.4)
WBC: 6 10*3/uL (ref 3.4–10.8)

## 2018-10-31 LAB — RPR: RPR Ser Ql: NONREACTIVE

## 2018-10-31 LAB — GLUCOSE, 1 HOUR GESTATIONAL: Gestational Diabetes Screen: 91 mg/dL (ref 65–139)

## 2018-11-28 ENCOUNTER — Other Ambulatory Visit: Payer: Self-pay

## 2018-11-28 ENCOUNTER — Encounter: Payer: Self-pay | Admitting: Obstetrics and Gynecology

## 2018-11-28 ENCOUNTER — Ambulatory Visit (INDEPENDENT_AMBULATORY_CARE_PROVIDER_SITE_OTHER): Payer: BC Managed Care – PPO | Admitting: Obstetrics and Gynecology

## 2018-11-28 VITALS — BP 108/68 | HR 98 | Wt 156.0 lb

## 2018-11-28 DIAGNOSIS — O0993 Supervision of high risk pregnancy, unspecified, third trimester: Secondary | ICD-10-CM | POA: Diagnosis not present

## 2018-11-28 DIAGNOSIS — Z8751 Personal history of pre-term labor: Secondary | ICD-10-CM | POA: Insufficient documentation

## 2018-11-28 DIAGNOSIS — R109 Unspecified abdominal pain: Secondary | ICD-10-CM

## 2018-11-28 DIAGNOSIS — O26893 Other specified pregnancy related conditions, third trimester: Secondary | ICD-10-CM

## 2018-11-28 DIAGNOSIS — Z23 Encounter for immunization: Secondary | ICD-10-CM | POA: Diagnosis not present

## 2018-11-28 DIAGNOSIS — O34219 Maternal care for unspecified type scar from previous cesarean delivery: Secondary | ICD-10-CM | POA: Insufficient documentation

## 2018-11-28 MED ORDER — TETANUS-DIPHTH-ACELL PERTUSSIS 5-2.5-18.5 LF-MCG/0.5 IM SUSP
0.5000 mL | Freq: Once | INTRAMUSCULAR | Status: AC
  Administered 2018-11-28: 0.5 mL via INTRAMUSCULAR

## 2018-11-28 NOTE — Progress Notes (Signed)
ROB: Patient complains of abdominal pain after working every day, has had to leave work in the middle of her shift several times. . Works on an [employer]er, television/film set. Discussed more frequent breaks if possible. Also advised on pregnancy girdle and Tylenol before start of shift. Patient can discuss with job if other job positions are available or if they allow for intermittent or early leave.  Desires to formula feed, desires no method for contraception (notes she was not on anything between last pregnancy and current pregnancy (11 years)). For Tdap today, signed blood consent.  RTC in 2 weeks.

## 2018-11-28 NOTE — Progress Notes (Signed)
ROB-Pt present today for prenatal care. Pt stated she has been calling out of work a lot due to being in extreme pain after working.

## 2018-12-03 ENCOUNTER — Encounter: Payer: BC Managed Care – PPO | Admitting: Obstetrics and Gynecology

## 2018-12-10 ENCOUNTER — Encounter: Payer: Self-pay | Admitting: Obstetrics and Gynecology

## 2018-12-10 ENCOUNTER — Other Ambulatory Visit: Payer: Self-pay

## 2018-12-10 ENCOUNTER — Ambulatory Visit (INDEPENDENT_AMBULATORY_CARE_PROVIDER_SITE_OTHER): Payer: BC Managed Care – PPO | Admitting: Obstetrics and Gynecology

## 2018-12-10 VITALS — BP 119/82 | HR 86 | Ht 69.0 in | Wt 154.0 lb

## 2018-12-10 DIAGNOSIS — O0993 Supervision of high risk pregnancy, unspecified, third trimester: Secondary | ICD-10-CM

## 2018-12-10 LAB — POCT URINALYSIS DIPSTICK OB
Bilirubin, UA: NEGATIVE
Blood, UA: NEGATIVE
Glucose, UA: NEGATIVE
Leukocytes, UA: NEGATIVE
Nitrite, UA: NEGATIVE
Spec Grav, UA: 1.01 (ref 1.010–1.025)
Urobilinogen, UA: 0.2 E.U./dL
pH, UA: 6.5 (ref 5.0–8.0)

## 2018-12-10 NOTE — Progress Notes (Signed)
ROB: Still has occasional pelvic pressure but patient has quit work because of " coronavirus concerns" and feels better about that.  We have discussed her large ketones and she says the only thing she drinks is soda.  We have addressed in some detail and she will try to drink more clear liquids.

## 2018-12-10 NOTE — Progress Notes (Signed)
Patient comes in today for [place] visit. Patient states that she is having a lot of pelvic pressure.

## 2018-12-15 DIAGNOSIS — M5489 Other dorsalgia: Secondary | ICD-10-CM | POA: Diagnosis not present

## 2018-12-23 ENCOUNTER — Telehealth: Payer: Self-pay

## 2018-12-23 NOTE — Telephone Encounter (Signed)
Pt prescreened no symptoms.   Coronavirus (COVID-19) Are you at risk?  Are you at risk for the Coronavirus (COVID-19)?  To be considered HIGH RISK for Coronavirus (COVID-19), you have to meet the following criteria:  . Traveled to [place], [place], [place], [place] or [place]; or in the [place] to [place], [place], [place], or [place]; and have fever, cough, and shortness of breath within the last 2 weeks of travel OR . Been in close contact with a person diagnosed with COVID-19 within the last 2 weeks and have fever, cough, and shortness of breath . IF YOU DO NOT MEET THESE CRITERIA, YOU ARE CONSIDERED LOW RISK FOR COVID-19.  What to do if you are HIGH RISK for COVID-19?  . If you are having a medical emergency, call 911. . Seek medical care right away. Before you go to a doctor's office, urgent care or emergency department, call ahead and tell them about your recent travel, contact with someone diagnosed with COVID-19, and your symptoms. You should receive instructions from your physician's office regarding next steps of care.  . When you arrive at healthcare provider, tell the healthcare staff immediately you have returned from visiting [place], [place], [place], [place] or [place]; or traveled in the [place] to [place], [place], [place], or [place]; in the last two weeks or you have been in close contact with a person diagnosed with COVID-19 in the last 2 weeks.   . Tell the health care staff about your symptoms: fever, cough and shortness of breath. . After you have been seen by a medical provider, you will be either: o Tested for (COVID-19) and discharged home on quarantine except to seek medical care if symptoms worsen, and asked to  - Stay home and avoid contact with others until you get your results (4-5 days)  - Avoid travel on public transportation if possible (such as bus, train, or airplane) or o Sent to the Emergency Department by EMS for evaluation,  COVID-19 testing, and possible admission depending on your condition and test results.  What to do if you are LOW RISK for COVID-19?  Reduce your risk of any infection by using the same precautions used for avoiding the common cold or flu:  . Wash your hands often with soap and warm water for at least 20 seconds.  If soap and water are not readily available, use an alcohol-based hand sanitizer with at least 60% alcohol.  . If coughing or sneezing, cover your mouth and nose by coughing or sneezing into the elbow areas of your shirt or coat, into a tissue or into your sleeve (not your hands). . Avoid shaking hands with others and consider head nods or verbal greetings only. . Avoid touching your eyes, nose, or mouth with unwashed hands.  . Avoid close contact with people who are sick. . Avoid places or events with large numbers of people in one location, like concerts or sporting events. . Carefully consider travel plans you have or are making. . If you are planning any travel outside or inside the [place], visit the CDC's Travelers' Health webpage for the latest health notices. . If you have some symptoms but not all symptoms, continue to monitor at home and seek medical attention if your symptoms worsen. . If you are having a medical emergency, call 911.   ADDITIONAL HEALTHCARE OPTIONS FOR PATIENTS  [facility] / e-Visit: [email or web address]         [facility]: [phone]  [facility]: [phone]                   [facility]: [phone]  

## 2018-12-23 NOTE — Progress Notes (Signed)
ROB-Pt present for prenatal care. Pt stated having a lot of lower abd pain.

## 2018-12-24 ENCOUNTER — Ambulatory Visit (INDEPENDENT_AMBULATORY_CARE_PROVIDER_SITE_OTHER): Payer: BC Managed Care – PPO | Admitting: Obstetrics and Gynecology

## 2018-12-24 ENCOUNTER — Encounter: Payer: Self-pay | Admitting: Obstetrics and Gynecology

## 2018-12-24 ENCOUNTER — Other Ambulatory Visit: Payer: Self-pay

## 2018-12-24 VITALS — BP 111/77 | HR 109 | Wt 155.7 lb

## 2018-12-24 DIAGNOSIS — Z3403 Encounter for supervision of normal first pregnancy, third trimester: Secondary | ICD-10-CM

## 2018-12-24 LAB — POCT URINALYSIS DIPSTICK OB
Bilirubin, UA: NEGATIVE
Blood, UA: NEGATIVE
Glucose, UA: NEGATIVE
Ketones, UA: NEGATIVE
Leukocytes, UA: NEGATIVE
Nitrite, UA: NEGATIVE
Spec Grav, UA: 1.01 (ref 1.010–1.025)
Urobilinogen, UA: 0.2 E.U./dL
pH, UA: 8.5 — AB (ref 5.0–8.0)

## 2018-12-24 NOTE — Progress Notes (Signed)
ROB: Patient notes some lower abdominal pain near prior C-section scar, discussed this was normal. Notes she has cut back on sodas and increasing water intake. 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 Paperwork given. RTC in 2 weeks for 36 week labs at that time.

## 2019-01-07 ENCOUNTER — Ambulatory Visit (INDEPENDENT_AMBULATORY_CARE_PROVIDER_SITE_OTHER): Payer: BC Managed Care – PPO | Admitting: Obstetrics and Gynecology

## 2019-01-07 ENCOUNTER — Encounter: Payer: Self-pay | Admitting: Obstetrics and Gynecology

## 2019-01-07 ENCOUNTER — Other Ambulatory Visit: Payer: Self-pay

## 2019-01-07 VITALS — BP 113/77 | HR 93 | Ht 69.0 in | Wt 159.0 lb

## 2019-01-07 DIAGNOSIS — Z3403 Encounter for supervision of normal first pregnancy, third trimester: Secondary | ICD-10-CM | POA: Diagnosis not present

## 2019-01-07 DIAGNOSIS — Z98891 History of uterine scar from previous surgery: Secondary | ICD-10-CM

## 2019-01-07 LAB — POCT URINALYSIS DIPSTICK OB
Bilirubin, UA: NEGATIVE
Blood, UA: NEGATIVE
Glucose, UA: NEGATIVE
Ketones, UA: NEGATIVE
Leukocytes, UA: NEGATIVE
Nitrite, UA: NEGATIVE
Spec Grav, UA: 1.01 (ref 1.010–1.025)
Urobilinogen, UA: 0.2 E.U./dL
pH, UA: 6.5 (ref 5.0–8.0)

## 2019-01-07 LAB — OB RESULTS CONSOLE GC/CHLAMYDIA: Gonorrhea: NEGATIVE

## 2019-01-07 NOTE — Addendum Note (Signed)
Addended by: Durwin Glaze on: 01/07/2019 12:56 PM   Modules accepted: Orders

## 2019-01-07 NOTE — Addendum Note (Signed)
Addended by: Durwin Glaze on: 01/07/2019 10:08 AM   Modules accepted: Orders

## 2019-01-07 NOTE — Progress Notes (Signed)
Patient comes in today for ROB visit. No concerns today. 

## 2019-01-07 NOTE — Progress Notes (Signed)
ROB:  GC/CT - GBS done.  S&S discussed

## 2019-01-09 LAB — STREP GP B NAA: Strep Gp B NAA: NEGATIVE

## 2019-01-11 LAB — GC/CHLAMYDIA PROBE AMP
Chlamydia trachomatis, NAA: NEGATIVE
Neisseria Gonorrhoeae by PCR: NEGATIVE

## 2019-01-14 NOTE — Progress Notes (Signed)
ROB-Pt present for routine prenatal care. Pt stated that she is doing well no problems.  

## 2019-01-15 ENCOUNTER — Ambulatory Visit (INDEPENDENT_AMBULATORY_CARE_PROVIDER_SITE_OTHER): Payer: BC Managed Care – PPO | Admitting: Obstetrics and Gynecology

## 2019-01-15 ENCOUNTER — Other Ambulatory Visit: Payer: Self-pay

## 2019-01-15 ENCOUNTER — Encounter: Payer: Self-pay | Admitting: Obstetrics and Gynecology

## 2019-01-15 VITALS — BP 105/63 | HR 85 | Wt 160.7 lb

## 2019-01-15 DIAGNOSIS — O0993 Supervision of high risk pregnancy, unspecified, third trimester: Secondary | ICD-10-CM

## 2019-01-15 DIAGNOSIS — O34219 Maternal care for unspecified type scar from previous cesarean delivery: Secondary | ICD-10-CM

## 2019-01-15 DIAGNOSIS — Z98891 History of uterine scar from previous surgery: Secondary | ICD-10-CM

## 2019-01-15 DIAGNOSIS — O09299 Supervision of pregnancy with other poor reproductive or obstetric history, unspecified trimester: Secondary | ICD-10-CM

## 2019-01-15 NOTE — Progress Notes (Signed)
ROB: Doing well, no issues. Has not been hurting since she has stopped working.  Discussed plans for pain management, patient currently declining epidural but will consider other modalities. Ok with skin-to-skin. Discussed rooming in. Discussed circumcision and Pediatrician List given. 36 week labs negative.  RTC in 1 week.

## 2019-01-15 NOTE — Patient Instructions (Signed)
[place] Pediatrician List  [facility]  [address], [place], [place] [address]  Phone: [phone]  [facility] (second location)  [address] [place], [place] [address]  Phone: [phone]  [facility] ([place]) [address], [place], [place] [address] Phone: [phone]  [facility]  [address] [place], [place] [address]  Phone: [phone] 

## 2019-01-19 ENCOUNTER — Telehealth: Payer: Self-pay | Admitting: Obstetrics and Gynecology

## 2019-01-19 NOTE — Telephone Encounter (Signed)
The patient called and stated that she is going to need a refill of her nausea medication. Pt is aware that [facility] is out of office. Pt voiced understanding. Please advise.

## 2019-01-20 MED ORDER — PROMETHAZINE HCL 25 MG PO TABS: 25.0000 mg | ORAL_TABLET | Freq: Four times a day (QID) | ORAL | 0 refills | Status: DC | PRN

## 2019-01-20 NOTE — Telephone Encounter (Signed)
Prescription has been sent and LM for patient that it has been sent in.

## 2019-01-22 ENCOUNTER — Ambulatory Visit (INDEPENDENT_AMBULATORY_CARE_PROVIDER_SITE_OTHER): Payer: BC Managed Care – PPO | Admitting: Obstetrics and Gynecology

## 2019-01-22 ENCOUNTER — Encounter: Payer: Self-pay | Admitting: Obstetrics and Gynecology

## 2019-01-22 ENCOUNTER — Other Ambulatory Visit: Payer: Self-pay

## 2019-01-22 VITALS — BP 98/67 | HR 114 | Ht 69.0 in | Wt 161.0 lb

## 2019-01-22 DIAGNOSIS — O0993 Supervision of high risk pregnancy, unspecified, third trimester: Secondary | ICD-10-CM

## 2019-01-22 DIAGNOSIS — O34219 Maternal care for unspecified type scar from previous cesarean delivery: Secondary | ICD-10-CM

## 2019-01-22 DIAGNOSIS — Z98891 History of uterine scar from previous surgery: Secondary | ICD-10-CM

## 2019-01-22 LAB — POCT URINALYSIS DIPSTICK OB
Bilirubin, UA: NEGATIVE
Blood, UA: NEGATIVE
Glucose, UA: NEGATIVE
Ketones, UA: NEGATIVE
Leukocytes, UA: NEGATIVE
Nitrite, UA: NEGATIVE
Spec Grav, UA: 1.01 (ref 1.010–1.025)
Urobilinogen, UA: 0.2 E.U./dL
pH, UA: 7 (ref 5.0–8.0)

## 2019-01-22 NOTE — Progress Notes (Signed)
Patient comes in today for ROB visit. No concerns today. 

## 2019-01-22 NOTE — Progress Notes (Signed)
ROB: Denies contractions.  Has no complaints.  Unfavorable cervix.  Narrow pubic arch.

## 2019-01-29 ENCOUNTER — Observation Stay
Admission: EM | Admit: 2019-01-29 | Discharge: 2019-01-29 | Disposition: A | Discharge status: Home / Self Care | Payer: BC Managed Care – PPO | Attending: Obstetrics and Gynecology | Admitting: Obstetrics and Gynecology

## 2019-01-29 ENCOUNTER — Other Ambulatory Visit: Payer: Self-pay

## 2019-01-29 DIAGNOSIS — O26893 Other specified pregnancy related conditions, third trimester: Secondary | ICD-10-CM | POA: Diagnosis not present

## 2019-01-29 DIAGNOSIS — O26899 Other specified pregnancy related conditions, unspecified trimester: Secondary | ICD-10-CM | POA: Diagnosis present

## 2019-01-29 DIAGNOSIS — R1030 Lower abdominal pain, unspecified: Secondary | ICD-10-CM | POA: Diagnosis not present

## 2019-01-29 DIAGNOSIS — R109 Unspecified abdominal pain: Secondary | ICD-10-CM

## 2019-01-29 DIAGNOSIS — Z3A39 39 weeks gestation of pregnancy: Secondary | ICD-10-CM | POA: Diagnosis not present

## 2019-01-29 DIAGNOSIS — O368130 Decreased fetal movements, third trimester, not applicable or unspecified: Secondary | ICD-10-CM | POA: Diagnosis not present

## 2019-01-29 MED ORDER — ACETAMINOPHEN 500 MG PO TABS
1000.0000 mg | ORAL_TABLET | Freq: Four times a day (QID) | ORAL | Status: DC | PRN
  Administered 2019-01-29: 1000 mg via ORAL
  Filled 2019-01-29: qty 2

## 2019-01-29 NOTE — Final Progress Note (Signed)
L&D OB Triage Note  Caitlin [name] is a 28 y.o. G2P1001 female at [redacted]w[redacted]d, EDD Estimated Date of Delivery: 01/31/19 who presented to triage for complaints of lower abdominal pain (intermittent) 7/10.  Last intercourse was 4 days ago. Also noting some decreased fetal movement.  Of note, patient has h/o C-section x 1 desiring TOLAC.She was evaluated by the nurses with no significant findings of labor, fetal status reassuring. Vital signs stable. An NST was performed and has been reviewed by MD. She was treated with PO Tylenol and given food (as patient was nauseated earlier this morning).    Physical Exam:  Blood pressure 121/75, pulse 88, temperature 98.5 F (36.9 C), temperature source Oral, resp. rate 16, height 5\' 9"  (1.753 m), weight 73 kg, last menstrual period 04/15/2018.  Dilation: 1 Effacement (%): 50, 60 Cervical Position: Posterior Presentation: Undeterminable Exam by:: B. nielsen RN  NST INTERPRETATION: Indications: decreased fetal movement and abdominal pain  Mode: External Baseline Rate (A): 130 bpm Variability: Moderate Accelerations: 15 x 15 Decelerations: Variable     Contraction Frequency (min): 2-5  Impression: reactive   Plan: NST performed was reviewed and was found to be reactive. She was discharged home with bleeding/labor precautions.  Continue routine prenatal care. Follow up with OB/GYN as previously scheduled. Has appointment tomorrow. Advised on fetal kick counts.     Rubie Maid, MD [facility]

## 2019-01-29 NOTE — Discharge Summary (Signed)
Rn reviewed discharge instructions with patient. Gave patient opportunity for questions. No questions at this time. Pt verbalized understanding. Pt discharged home.

## 2019-01-29 NOTE — OB Triage Note (Signed)
Pt presents c/o lower abdominal pain that started last night. Pt states pain comes and goes. Rates pain 7/10 on pain scale. Denies any bleeding or LOF. Reports positive fetal movement but states not as much as normal. Last intercourse was 4 days ago. Vitals WNL. Will continue to monitor.

## 2019-01-29 NOTE — Discharge Instructions (Signed)

## 2019-01-30 ENCOUNTER — Ambulatory Visit (INDEPENDENT_AMBULATORY_CARE_PROVIDER_SITE_OTHER): Payer: BC Managed Care – PPO | Admitting: Obstetrics and Gynecology

## 2019-01-30 ENCOUNTER — Encounter: Payer: Self-pay | Admitting: Obstetrics and Gynecology

## 2019-01-30 VITALS — BP 99/67 | HR 99 | Ht 69.0 in | Wt 161.3 lb

## 2019-01-30 DIAGNOSIS — O0993 Supervision of high risk pregnancy, unspecified, third trimester: Secondary | ICD-10-CM

## 2019-01-30 NOTE — Progress Notes (Signed)
ROB: Patient was seen in labor and delivery for irregular contractions-not in labor.  Patient denies recent contractions.  Reports daily fetal movement.  Scheduled for biophysical profile Tuesday, Foley bulb Wednesday, induction Wednesday midnight into Thursday.

## 2019-01-30 NOTE — Progress Notes (Signed)
Patient comes in today for ROB visit. She has no concerns today.  

## 2019-02-03 ENCOUNTER — Ambulatory Visit
Admission: RE | Admit: 2019-02-03 | Discharge: 2019-02-03 | Disposition: A | Discharge status: Home / Self Care | Payer: BC Managed Care – PPO | Source: Ambulatory Visit

## 2019-02-03 ENCOUNTER — Other Ambulatory Visit: Payer: Self-pay

## 2019-02-03 ENCOUNTER — Other Ambulatory Visit: Payer: Self-pay | Admitting: Obstetrics and Gynecology

## 2019-02-03 ENCOUNTER — Ambulatory Visit (INDEPENDENT_AMBULATORY_CARE_PROVIDER_SITE_OTHER): Payer: BC Managed Care – PPO

## 2019-02-03 DIAGNOSIS — O480 Post-term pregnancy: Secondary | ICD-10-CM

## 2019-02-03 DIAGNOSIS — O0993 Supervision of high risk pregnancy, unspecified, third trimester: Secondary | ICD-10-CM

## 2019-02-03 DIAGNOSIS — Z3A40 40 weeks gestation of pregnancy: Secondary | ICD-10-CM

## 2019-02-04 ENCOUNTER — Encounter: Payer: Self-pay | Admitting: *Deleted

## 2019-02-04 ENCOUNTER — Other Ambulatory Visit: Payer: Self-pay

## 2019-02-04 ENCOUNTER — Encounter: Payer: Self-pay | Admitting: Obstetrics and Gynecology

## 2019-02-04 ENCOUNTER — Observation Stay (EMERGENCY_DEPARTMENT_HOSPITAL)
Admission: EM | Admit: 2019-02-04 | Discharge: 2019-02-04 | Disposition: A | Discharge status: Home / Self Care | Payer: BC Managed Care – PPO | Source: Home / Self Care | Admitting: Obstetrics and Gynecology

## 2019-02-04 ENCOUNTER — Ambulatory Visit (INDEPENDENT_AMBULATORY_CARE_PROVIDER_SITE_OTHER): Payer: BC Managed Care – PPO | Admitting: Obstetrics and Gynecology

## 2019-02-04 ENCOUNTER — Encounter: Payer: BC Managed Care – PPO | Admitting: Obstetrics and Gynecology

## 2019-02-04 VITALS — BP 111/76 | HR 75 | Wt 159.8 lb

## 2019-02-04 DIAGNOSIS — O0993 Supervision of high risk pregnancy, unspecified, third trimester: Secondary | ICD-10-CM

## 2019-02-04 DIAGNOSIS — Z20828 Contact with and (suspected) exposure to other viral communicable diseases: Secondary | ICD-10-CM | POA: Diagnosis not present

## 2019-02-04 DIAGNOSIS — Z3A40 40 weeks gestation of pregnancy: Secondary | ICD-10-CM

## 2019-02-04 DIAGNOSIS — O34219 Maternal care for unspecified type scar from previous cesarean delivery: Secondary | ICD-10-CM | POA: Diagnosis not present

## 2019-02-04 DIAGNOSIS — O471 False labor at or after 37 completed weeks of gestation: Secondary | ICD-10-CM

## 2019-02-04 DIAGNOSIS — Z79899 Other long term (current) drug therapy: Secondary | ICD-10-CM | POA: Insufficient documentation

## 2019-02-04 DIAGNOSIS — O480 Post-term pregnancy: Secondary | ICD-10-CM

## 2019-02-04 LAB — POCT URINALYSIS DIPSTICK OB
Bilirubin, UA: NEGATIVE
Blood, UA: NEGATIVE
Glucose, UA: NEGATIVE
Leukocytes, UA: NEGATIVE
Nitrite, UA: NEGATIVE
Spec Grav, UA: 1.015 (ref 1.010–1.025)
Urobilinogen, UA: 0.2 E.U./dL
pH, UA: 7 (ref 5.0–8.0)

## 2019-02-04 NOTE — OB Triage Note (Signed)
Recvd pt from ED. Pt c/o contractions every 15 minutes that started around 2300 last night. Pt denies LOF or vaginal bleeding. Pt is feeling baby move well.

## 2019-02-04 NOTE — Patient Instructions (Signed)

## 2019-02-04 NOTE — OB Triage Note (Signed)
Pt to be discharged home. Discharge instructions reviewed with the patient and she has non further questions at this time. Pt instructed to keep her doctor appointment for 11am this morning and return to hospital tonight at midnight for IOL.

## 2019-02-04 NOTE — Progress Notes (Signed)
ROB: Patient seen in triage this morning due to contractions, ruled out for labor. Presents for foley bulb insertion. For scheduled IOL at midnight tonight.    FOLEY BULB PROCEDURE NOTE:    I have verified that the patient is an appropriate candidate for outpatient foley bulb placement.  She has consented to foley bulb placement today. She has no concerns at this time. A pre-procedure NST performed today was reviewed and was found to be reactive.     The patient was placed in the dorsal lithotomy position.  A speculum was inserted into the vagina and the cervix was visualized. The cervix was noted to be 1 cm dilated  A foley catheter was advanced into the cervix slightly pass the internal cervical os.  The foley balloon was filled with 40 cc of normal saline.  Gentle traction was placed on the foley bulb, and the catheter was taped to the medial portion of the thigh.  The patient tolerated the procedure well.  A post-procedure NST was performed. Sterile technique was observed throughout.     NONSTRESS TEST INTERPRETATION  INDICATIONS: Foley bulb induction placement  FHR baseline: Patient had NST at hospital this morning. FHR 140 bpm (post procedure) RESULTS:Reactive COMMENTS: Contractions q 3 minutes.    PLAN: 1. To arrive at Labor and Delivery for scheduled induction of labor on (02/05/2019 at midnight).  She was given post-procedure instructions.     Rubie Maid, MD [facility]

## 2019-02-04 NOTE — Progress Notes (Signed)
ROB-Pt present today for routine prenatal care. Pt stated having a lot of contractions off and on throughout the night and this morning causing her to go to L&D.

## 2019-02-04 NOTE — Discharge Summary (Signed)
    L&D OB Triage Note  SUBJECTIVE Caitlin [name] is a 28 y.o. G2P1001 female at [redacted]w[redacted]d, EDD Estimated Date of Delivery: 01/31/19 who presented to triage with complaints of contractions.   OB History  Gravida Para Term Preterm AB Living  2 1 1  0 0 1  SAB TAB Ectopic Multiple Live Births  0 0 0 0 0    # Outcome Date GA Lbr Len/2nd Weight Sex Delivery Anes PTL Lv  2 Current           1 Term 07/07/07     CS-Unspec       Medications Prior to Admission  Medication Sig Dispense Refill Last Dose  . promethazine (PHENERGAN) 25 MG tablet Take 1 tablet (25 mg total) by mouth every 6 (six) hours as needed for nausea or vomiting. 30 tablet 0 02/03/2019 at Unknown time  . Albuterol Sulfate (PROAIR RESPICLICK) 878 (90 Base) MCG/ACT AEPB Inhale 2 puffs into the lungs 3 (three) times daily as needed. 1 each 0   . aspirin EC 81 MG tablet Take 1 tablet (81 mg total) by mouth daily. Take after 12 weeks for prevention of preeclampssia later in pregnancy (Patient not taking: Reported on 02/04/2019) 300 tablet 2 Not Taking at Unknown time  . Prenatal MV-Min-FA-Omega-3 (PRENATAL GUMMIES/DHA & FA) 0.4-32.5 MG CHEW Chew 0.4 mg by mouth daily. (Patient not taking: Reported on 01/29/2019) 30 tablet 11 Not Taking at Unknown time     OBJECTIVE  Nursing Evaluation:   LMP 04/15/2018 (LMP Unknown)    Findings:   Irregular contractions, patient not in labor no cervical change noted.  Fetus reassuring.  NST was performed and has been reviewed by me.  NST INTERPRETATION: Category I  Mode: External Baseline Rate (A): 125 bpm Variability: Moderate Accelerations: 15 x 15 Decelerations: None     Contraction Frequency (min): 4-5  ASSESSMENT Impression:  1.  Pregnancy:  G2P1001 at [redacted]w[redacted]d , EDD Estimated Date of Delivery: 01/31/19 2.  NST:  Category I  PLAN 1. Reassurance given 2. Discharge home with standard labor precautions given to return to L&D or call the office for problems. 3. Continue routine prenatal  care. 4.  As labor and delivery busy now will place Foley bulb as scheduled and begin induction tomorrow.

## 2019-02-05 ENCOUNTER — Inpatient Hospital Stay
Admission: EM | Admit: 2019-02-05 | Discharge: 2019-02-06 | DRG: 807 | Disposition: A | Discharge status: Home / Self Care | Payer: BC Managed Care – PPO | Attending: Obstetrics and Gynecology | Admitting: Obstetrics and Gynecology

## 2019-02-05 ENCOUNTER — Inpatient Hospital Stay: Payer: BC Managed Care – PPO | Admitting: Anesthesiology

## 2019-02-05 DIAGNOSIS — Z3A40 40 weeks gestation of pregnancy: Secondary | ICD-10-CM | POA: Diagnosis not present

## 2019-02-05 DIAGNOSIS — O480 Post-term pregnancy: Secondary | ICD-10-CM | POA: Diagnosis present

## 2019-02-05 DIAGNOSIS — O34219 Maternal care for unspecified type scar from previous cesarean delivery: Secondary | ICD-10-CM | POA: Diagnosis present

## 2019-02-05 DIAGNOSIS — Z20828 Contact with and (suspected) exposure to other viral communicable diseases: Secondary | ICD-10-CM | POA: Diagnosis present

## 2019-02-05 LAB — TYPE AND SCREEN
ABO/RH(D): O POS
Antibody Screen: NEGATIVE

## 2019-02-05 LAB — CBC
HCT: 38.5 % (ref 36.0–46.0)
Hemoglobin: 13.1 g/dL (ref 12.0–15.0)
MCH: 31.5 pg (ref 26.0–34.0)
MCHC: 34 g/dL (ref 30.0–36.0)
MCV: 92.5 fL (ref 80.0–100.0)
Platelets: 310 10*3/uL (ref 150–400)
RBC: 4.16 MIL/uL (ref 3.87–5.11)
RDW: 14.2 % (ref 11.5–15.5)
WBC: 9.4 10*3/uL (ref 4.0–10.5)
nRBC: 0 % (ref 0.0–0.2)

## 2019-02-05 LAB — SARS CORONAVIRUS 2 BY RT PCR (HOSPITAL ORDER, PERFORMED IN ~~LOC~~ HOSPITAL LAB): SARS Coronavirus 2: NEGATIVE

## 2019-02-05 MED ORDER — OXYTOCIN 40 UNITS IN NORMAL SALINE INFUSION - SIMPLE MED
1.0000 m[IU]/min | INTRAVENOUS | Status: DC
  Administered 2019-02-05: 4 m[IU]/min via INTRAVENOUS
  Filled 2019-02-05: qty 1000

## 2019-02-05 MED ORDER — MISOPROSTOL 200 MCG PO TABS
ORAL_TABLET | ORAL | Status: AC
  Filled 2019-02-05: qty 4

## 2019-02-05 MED ORDER — ACETAMINOPHEN 325 MG PO TABS
650.0000 mg | ORAL_TABLET | ORAL | Status: DC | PRN
  Administered 2019-02-06: 650 mg via ORAL
  Filled 2019-02-05 (×2): qty 2

## 2019-02-05 MED ORDER — EPHEDRINE 5 MG/ML INJ: 10.0000 mg | INTRAVENOUS | Status: DC | PRN

## 2019-02-05 MED ORDER — FENTANYL 2.5 MCG/ML W/ROPIVACAINE 0.15% IN NS 100 ML EPIDURAL (ARMC)
12.0000 mL/h | EPIDURAL | Status: DC
  Administered 2019-02-05: 12 mL/h via EPIDURAL

## 2019-02-05 MED ORDER — DOCUSATE SODIUM 100 MG PO CAPS
100.0000 mg | ORAL_CAPSULE | Freq: Two times a day (BID) | ORAL | Status: DC
  Administered 2019-02-05: 100 mg via ORAL
  Filled 2019-02-05: qty 1

## 2019-02-05 MED ORDER — DIPHENHYDRAMINE HCL 50 MG/ML IJ SOLN: 12.5000 mg | INTRAMUSCULAR | Status: DC | PRN

## 2019-02-05 MED ORDER — OXYTOCIN 40 UNITS IN NORMAL SALINE INFUSION - SIMPLE MED
2.5000 [IU]/h | INTRAVENOUS | Status: DC | PRN
  Filled 2019-02-05: qty 1000

## 2019-02-05 MED ORDER — OXYCODONE-ACETAMINOPHEN 5-325 MG PO TABS: 1.0000 | ORAL_TABLET | ORAL | Status: DC | PRN

## 2019-02-05 MED ORDER — ZOLPIDEM TARTRATE 5 MG PO TABS: 5.0000 mg | ORAL_TABLET | Freq: Every evening | ORAL | Status: DC | PRN

## 2019-02-05 MED ORDER — OXYTOCIN 40 UNITS IN NORMAL SALINE INFUSION - SIMPLE MED
2.5000 [IU]/h | INTRAVENOUS | Status: DC
  Administered 2019-02-05: 2.5 [IU]/h via INTRAVENOUS

## 2019-02-05 MED ORDER — TETANUS-DIPHTH-ACELL PERTUSSIS 5-2.5-18.5 LF-MCG/0.5 IM SUSP: 0.5000 mL | Freq: Once | INTRAMUSCULAR | Status: DC

## 2019-02-05 MED ORDER — ONDANSETRON HCL 4 MG/2ML IJ SOLN
4.0000 mg | Freq: Four times a day (QID) | INTRAMUSCULAR | Status: DC | PRN
  Administered 2019-02-05: 4 mg via INTRAVENOUS
  Filled 2019-02-05: qty 2

## 2019-02-05 MED ORDER — PHENYLEPHRINE 40 MCG/ML (10ML) SYRINGE FOR IV PUSH (FOR BLOOD PRESSURE SUPPORT): 80.0000 ug | PREFILLED_SYRINGE | INTRAVENOUS | Status: DC | PRN

## 2019-02-05 MED ORDER — LACTATED RINGERS IV SOLN
500.0000 mL | INTRAVENOUS | Status: DC | PRN
  Administered 2019-02-05: 1000 mL via INTRAVENOUS

## 2019-02-05 MED ORDER — SIMETHICONE 80 MG PO CHEW: 80.0000 mg | CHEWABLE_TABLET | ORAL | Status: DC | PRN

## 2019-02-05 MED ORDER — LIDOCAINE HCL (PF) 1 % IJ SOLN: 30.0000 mL | INTRAMUSCULAR | Status: DC | PRN

## 2019-02-05 MED ORDER — IBUPROFEN 600 MG PO TABS
600.0000 mg | ORAL_TABLET | Freq: Four times a day (QID) | ORAL | Status: DC
  Administered 2019-02-05 – 2019-02-06 (×4): 600 mg via ORAL
  Filled 2019-02-05 (×5): qty 1

## 2019-02-05 MED ORDER — AMMONIA AROMATIC IN INHA
RESPIRATORY_TRACT | Status: AC
  Filled 2019-02-05: qty 10

## 2019-02-05 MED ORDER — SOD CITRATE-CITRIC ACID 500-334 MG/5ML PO SOLN: 30.0000 mL | ORAL | Status: DC | PRN

## 2019-02-05 MED ORDER — ACETAMINOPHEN 325 MG PO TABS: 650.0000 mg | ORAL_TABLET | ORAL | Status: DC | PRN

## 2019-02-05 MED ORDER — TERBUTALINE SULFATE 1 MG/ML IJ SOLN: 0.2500 mg | Freq: Once | INTRAMUSCULAR | Status: DC | PRN

## 2019-02-05 MED ORDER — SODIUM CHLORIDE 0.9 % IV SOLN
INTRAVENOUS | Status: DC | PRN
  Administered 2019-02-05 (×3): 5 mL via EPIDURAL

## 2019-02-05 MED ORDER — BENZOCAINE-MENTHOL 20-0.5 % EX AERO: 1.0000 "application " | INHALATION_SPRAY | CUTANEOUS | Status: DC | PRN

## 2019-02-05 MED ORDER — DIPHENHYDRAMINE HCL 25 MG PO CAPS: 25.0000 mg | ORAL_CAPSULE | Freq: Four times a day (QID) | ORAL | Status: DC | PRN

## 2019-02-05 MED ORDER — LIDOCAINE HCL (PF) 1 % IJ SOLN
INTRAMUSCULAR | Status: DC | PRN
  Administered 2019-02-05: 1 mL

## 2019-02-05 MED ORDER — BUTORPHANOL TARTRATE 1 MG/ML IJ SOLN
INTRAMUSCULAR | Status: AC
  Administered 2019-02-05: 1 mg
  Filled 2019-02-05: qty 1

## 2019-02-05 MED ORDER — LIDOCAINE HCL (PF) 1 % IJ SOLN
INTRAMUSCULAR | Status: AC
  Filled 2019-02-05: qty 30

## 2019-02-05 MED ORDER — OXYTOCIN BOLUS FROM INFUSION
500.0000 mL | Freq: Once | INTRAVENOUS | Status: AC
  Administered 2019-02-05: 500 mL via INTRAVENOUS

## 2019-02-05 MED ORDER — OXYTOCIN 10 UNIT/ML IJ SOLN
INTRAMUSCULAR | Status: AC
  Filled 2019-02-05: qty 2

## 2019-02-05 MED ORDER — LACTATED RINGERS IV SOLN
500.0000 mL | Freq: Once | INTRAVENOUS | Status: AC
  Administered 2019-02-05: 500 mL via INTRAVENOUS

## 2019-02-05 MED ORDER — PRENATAL MULTIVITAMIN CH
1.0000 | ORAL_TABLET | Freq: Every day | ORAL | Status: DC
  Administered 2019-02-05: 1 via ORAL
  Filled 2019-02-05: qty 1

## 2019-02-05 MED ORDER — LACTATED RINGERS IV SOLN
INTRAVENOUS | Status: DC
  Administered 2019-02-05: 02:00:00 via INTRAVENOUS

## 2019-02-05 NOTE — Anesthesia Preprocedure Evaluation (Signed)
Anesthesia Evaluation  Patient identified by MRN, date of birth, ID band Patient awake    Reviewed: Allergy & Precautions, H&P , NPO status , Patient's Chart, lab work & pertinent test results  Airway Mallampati: II  TM Distance: >3 FB Neck ROM: full    Dental  (+) Chipped, Teeth Intact   Pulmonary neg pulmonary ROS, former smoker,           Cardiovascular Exercise Tolerance: Good (-) hypertensionnegative cardio ROS       Neuro/Psych    GI/Hepatic negative GI ROS,   Endo/Other    Renal/GU   negative genitourinary   Musculoskeletal   Abdominal   Peds  Hematology negative hematology ROS (+)   Anesthesia Other Findings Past Medical History: No date: History of pre-eclampsia in prior pregnancy, currently  pregnant No date: Ovarian cyst  Past Surgical History: 07/07/2007: CESAREAN SECTION     Reproductive/Obstetrics (+) Pregnancy                             Anesthesia Physical Anesthesia Plan  ASA: II  Anesthesia Plan: Epidural   Post-op Pain Management:    Induction:   PONV Risk Score and Plan:   Airway Management Planned:   Additional Equipment:   Intra-op Plan:   Post-operative Plan:   Informed Consent: I have reviewed the patients History and Physical, chart, labs and discussed the procedure including the risks, benefits and alternatives for the proposed anesthesia with the patient or authorized representative who has indicated his/her understanding and acceptance.       Plan Discussed with: Anesthesiologist  Anesthesia Plan Comments:         Anesthesia Quick Evaluation

## 2019-02-05 NOTE — Discharge Instructions (Signed)

## 2019-02-05 NOTE — Anesthesia Procedure Notes (Signed)
Epidural Patient location during procedure: OB Start time: 02/05/2019 4:20 AM End time: 02/05/2019 4:31 AM  Staffing Anesthesiologist: Durenda Hurt, MD Performed: anesthesiologist   Preanesthetic Checklist Completed: patient identified, site marked, surgical consent, pre-op evaluation, timeout performed, IV checked, risks and benefits discussed and monitors and equipment checked  Epidural Patient position: sitting Prep: ChloraPrep Patient monitoring: heart rate, continuous pulse ox and blood pressure Approach: midline Location: L4-L5 Injection technique: LOR saline  Needle:  Needle type: Tuohy  Needle gauge: 18 G Needle length: 9 cm and 9 Needle insertion depth: 5 cm Catheter type: closed end flexible Catheter size: 20 Guage Catheter at skin depth: 9 cm Test dose: negative and Other  Assessment Events: blood not aspirated, injection not painful, no injection resistance, negative IV test and no paresthesia  Additional Notes All risks discussed and patient consented to proceed with epidural placement. Negative aspiration.  Negative paresthesia on injection.  Dose given in divided aliquots. Patient tolerated the insertion well without complications.Reason for block:procedure for pain

## 2019-02-05 NOTE — H&P (Signed)
History and Physical   HPI  Caitlin [name] is a 28 y.o. G2P1001 at [redacted]w[redacted]d Estimated Date of Delivery: 01/31/19 who is being admitted for induction for postdates.  Patient desires trial of labor after cesarean.    OB History  OB History  Gravida Para Term Preterm AB Living  2 1 1  0 0 1  SAB TAB Ectopic Multiple Live Births  0 0 0 0 0    # Outcome Date GA Lbr Len/2nd Weight Sex Delivery Anes PTL Lv  2 Current           1 Term 07/07/07     CS-Unspec       PROBLEM LIST  Pregnancy complications or risks: Patient Active Problem List   Diagnosis Date Noted  . Labor and delivery, indication for care 02/05/2019  . Indication for care in labor or delivery 02/04/2019  . Decreased fetal movement affecting management of pregnancy in third trimester 01/29/2019  . Abdominal pain affecting pregnancy 01/29/2019  . Abdominal pain in pregnancy, third trimester 01/29/2019  . Desires VBAC (vaginal birth after cesarean) trial 11/28/2018  . History of preterm delivery 11/28/2018  . History of pre-eclampsia in prior pregnancy, currently pregnant 08/19/2018  . History of cesarean delivery 08/19/2018  . Chronic pelvic pain in female 02/11/2013     Prenatal labs and studies: ABO, Rh: --/--/O POS (08/20 0129) Antibody: NEG (08/20 0129) Rubella: 4.51 (01/20 1022) RPR: Non Reactive (05/14 1002)  HBsAg: Negative (01/20 1022)  HIV: Non Reactive (01/20 1022)  BHA:LPFXTKWI (07/22 1050)   Past Medical History:  Diagnosis Date  . History of pre-eclampsia in prior pregnancy, currently pregnant   . Ovarian cyst      Past Surgical History:  Procedure Laterality Date  . CESAREAN SECTION  07/07/2007     Medications    Current Discharge Medication List    CONTINUE these medications which have NOT CHANGED   Details  promethazine (PHENERGAN) 25 MG tablet Take 1 tablet (25 mg total) by mouth every 6 (six) hours as needed for nausea or vomiting. Qty: 30 tablet, Refills: 0    Albuterol  Sulfate (PROAIR RESPICLICK) 097 (90 Base) MCG/ACT AEPB Inhale 2 puffs into the lungs 3 (three) times daily as needed. Qty: 1 each, Refills: 0    aspirin EC 81 MG tablet Take 1 tablet (81 mg total) by mouth daily. Take after 12 weeks for prevention of preeclampssia later in pregnancy Qty: 300 tablet, Refills: 2    Prenatal MV-Min-FA-Omega-3 (PRENATAL GUMMIES/DHA & FA) 0.4-32.5 MG CHEW Chew 0.4 mg by mouth daily. Qty: 30 tablet, Refills: 11         Allergies  Patient has no known allergies.  Review of Systems  Pertinent items are noted in HPI.  Physical Exam  BP 116/72 (BP Location: Left Arm)   Pulse 99   Temp 98.3 F (36.8 C) (Oral)   Resp 16   LMP 04/15/2018 (LMP Unknown)   Lungs:  CTA B Cardio: RRR without M/R/G Abd: Soft, gravid, NT Presentation: cephalic EXT: No C/C/ 1+ Edema DTRs: 2+ B CERVIX:   Foley bulb was placed intracervical in the office today and is still in place at the time of admission.  See Prenatal records for more detailed PE.    FHR:  Variability: Good {> 6 bpm)  Toco: Uterine Contractions: irreg   Test Results  Results for orders placed or performed during the hospital encounter of 02/05/19 (from the past 24 hour(s))  SARS Coronavirus 2 (  Hospital order, Performed in [facility] hospital lab) Nasopharyngeal Nasopharyngeal Swab     Status: None   Collection Time: 02/05/19 12:16 AM   Specimen: Nasopharyngeal Swab  Result Value Ref Range   SARS Coronavirus 2 NEGATIVE NEGATIVE  CBC     Status: None   Collection Time: 02/05/19  1:29 AM  Result Value Ref Range   WBC 9.4 4.0 - 10.5 K/uL   RBC 4.16 3.87 - 5.11 MIL/uL   Hemoglobin 13.1 12.0 - 15.0 g/dL   HCT 16.138.5 09.636.0 - 04.546.0 %   MCV 92.5 80.0 - 100.0 fL   MCH 31.5 26.0 - 34.0 pg   MCHC 34.0 30.0 - 36.0 g/dL   RDW 40.914.2 81.111.5 - 91.415.5 %   Platelets 310 150 - 400 K/uL   nRBC 0.0 0.0 - 0.2 %  Type and screen [facility]     Status: None   Collection Time: 02/05/19  1:29  AM  Result Value Ref Range   ABO/RH(D) O POS    Antibody Screen NEG    Sample Expiration      02/08/2019,2359 Performed at [facility], [address] [place], [place] [address]      Assessment   G2P1001 at 3363w5d Estimated Date of Delivery: 01/31/19  The fetus is reassuring.    Patient Active Problem List   Diagnosis Date Noted  . Labor and delivery, indication for care 02/05/2019  . Indication for care in labor or delivery 02/04/2019  . Decreased fetal movement affecting management of pregnancy in third trimester 01/29/2019  . Abdominal pain affecting pregnancy 01/29/2019  . Abdominal pain in pregnancy, third trimester 01/29/2019  . Desires VBAC (vaginal birth after cesarean) trial 11/28/2018  . History of preterm delivery 11/28/2018  . History of pre-eclampsia in prior pregnancy, currently pregnant 08/19/2018  . History of cesarean delivery 08/19/2018  . Chronic pelvic pain in female 02/11/2013    Plan  1. Admit to L&D :   IV Pitocin augmentation and IV Pitocin induction 2. EFM: -- Category 1 3. Epidural if desired.  Stadol for IV pain until epidural requested. 4. Admission labs    Elonda Huskyavid J. Kaili Castille, M.D. 02/05/2019 5:59 AM

## 2019-02-06 ENCOUNTER — Telehealth: Payer: Self-pay | Admitting: Obstetrics and Gynecology

## 2019-02-06 LAB — RPR: RPR Ser Ql: NONREACTIVE

## 2019-02-06 NOTE — Telephone Encounter (Signed)
Pts insurance company called to verify Pt's delivery date and type of delivery. Reviewed delivery note.

## 2019-02-06 NOTE — Discharge Summary (Signed)
                              Discharge Summary  Date of Admission: 02/05/2019  Date of Discharge: 02/06/2019  Admitting Diagnosis: Induction of labor at [redacted]w[redacted]d TOLAC  Mode of Delivery: normal spontaneous vaginal delivery                 Discharge Diagnosis: No other diagnosis   Intrapartum Procedures: epidural, pitocin augmentation and Foley bulb   Post partum procedures:   Complications: none                      Discharge Day SOAP Note:  Progress Note - Vaginal Delivery  Caitlin [name] is a 28 y.o. G2P2002 now PP day 1 s/p VBAC, Spontaneous . Delivery was uncomplicated  Subjective  The patient has the following complaints: has no unusual complaints  Pain is controlled with current medications.   Patient is urinating without difficulty.  She is ambulating well.    Objective  Vital signs: BP 98/76   Pulse 75   Temp 98 F (36.7 C) (Oral)   Resp 20   Ht 5\' 9"  (1.753 m)   Wt 72.5 kg   LMP 04/15/2018 (LMP Unknown)   SpO2 100%   Breastfeeding Unknown   BMI 23.60 kg/m   Physical Exam: Gen: NAD Fundus Fundal Tone: Firm  Lochia Amount: Small  Perineum Appearance: Intact     Data Review Labs: CBC Latest Ref Rng & Units 02/05/2019 10/30/2018 07/29/2018  WBC 4.0 - 10.5 K/uL 9.4 6.0 5.7  Hemoglobin 12.0 - 15.0 g/dL 13.1 11.9 13.1  Hematocrit 36.0 - 46.0 % 38.5 34.5 38.9  Platelets 150 - 400 K/uL 310 276 292   O POS  Assessment/Plan  Active Problems:   Labor and delivery, indication for care    Plan for discharge today.   Discharge Instructions: Per After Visit Summary. Activity: Advance as tolerated. Pelvic rest for 6 weeks.  Also refer to After Visit Summary Diet: Regular Medications: Allergies as of 02/06/2019   No Known Allergies     Medication List    STOP taking these medications   aspirin EC 81 MG tablet   promethazine 25 MG tablet Commonly known as: PHENERGAN     TAKE these medications   Albuterol Sulfate 108 (90 Base) MCG/ACT Aepb  Commonly known as: ProAir RespiClick Inhale 2 puffs into the lungs 3 (three) times daily as needed.   Prenatal Gummies/DHA & FA 0.4-32.5 MG Chew Chew 0.4 mg by mouth daily.      Outpatient follow up:  Follow-up Information    Harlin Heys, MD Follow up in 6 week(s).   Specialties: Obstetrics and Gynecology, Radiology Contact information: [address] [place] [address] [phone]          Postpartum contraception: Will discuss at first office visit post-partum  Discharged Condition: good  Discharged to: home  Newborn Data: Disposition:home with mother  Apgars: APGAR (1 MIN): 8   APGAR (5 MINS): 9   APGAR (10 MINS):    Baby Feeding: Bottle    Finis Bud, M.D. 02/06/2019 8:54 AM

## 2019-02-06 NOTE — Anesthesia Postprocedure Evaluation (Signed)
Anesthesia Post Note  Patient: Caitlin [name]  Procedure(s) Performed: AN AD HOC LABOR EPIDURAL  Patient location during evaluation: Mother Baby Anesthesia Type: Epidural Level of consciousness: awake, awake and alert and oriented Pain management: pain level controlled Vital Signs Assessment: post-procedure vital signs reviewed and stable Respiratory status: spontaneous breathing, nonlabored ventilation and respiratory function stable Cardiovascular status: blood pressure returned to baseline and stable Postop Assessment: no headache and no backache Anesthetic complications: no     Last Vitals:  Vitals:   02/05/19 2104 02/05/19 2324  BP: 122/78 98/76  Pulse: 79 75  Resp: 20 20  Temp: 36.7 C   SpO2: 100% 100%    Last Pain:  Vitals:   02/06/19 0500  TempSrc:   PainSc: 0-No pain                 Johnna Acosta

## 2019-02-06 NOTE — Progress Notes (Signed)
DC inst reviewed with mother.  Questions answered and verb u/o of f/u care.  NB placed in infant car seat

## 2019-02-06 NOTE — Clinical Social Work Peds Assess (Signed)
  CLINICAL SOCIAL WORK PEDIATRIC ASSESSMENT NOTE  Patient Details  Name: Caitlin [name] MRN: [id number] Date of Birth: 02-25-1991  Date:  02/06/2019  Clinical Social Worker Initiating Note:  Mel Almond Kerryn Tennant, LCSW Date/Time: Initiated:  02/06/19/1105     Child's Name:  Caitlin [name]   Biological Parents:      Need for Interpreter:  None   Reason for Referral:  Current Substance Use/Substance Use During Pregnancy    Address:     [address] [place], [place] [address]   Phone number:    Mother's cell phone # [phone]  Household Members:    [name] significant other   Natural Supports (not living in the home):  Parent   Professional Supports:     Employment: Animator   Type of Work:     Education:      Pensions consultant:  [employer], Multimedia programmer   Other Resources:      Cultural/Religious Considerations Which May Impact Care:    Strengths:      Risk Factors/Current Problems:  Substance Use    Cognitive State:  Goal Oriented , Able to Concentrate , Alert    Mood/Affect:  Happy , Calm    CSW Assessment: Clinical Education officer, museum (CSW) received consult that mother and infant's UDS is positive for marijuana. Per RN mother and father have been approrpate and there are no other concerns. CSW met with mother, father and infant at bedside. CSW introduced self and explained role of CSW department. Per mother she works full time and has Forensic scientist. Per mother she is going to add infant to her BCBS plan through her employer. Mother reported that she has 12 weeks of maternity leave to stay home with infant. Per mother this is her 2nd child and she has an 29 y.o son at home. Per mother her 76 y.o is named Research scientist (medical). Mother reported that she has all the supplies needed for infant including car seat. Per mother her mother is watching her 29 y.o while she is at [employer]. Mother reported that her family lives 2.5 hours away and are supportive at times. Mother  reported that she used marijuana occasionally during her pregnancy. Mother reported that she does not use other drugs and does not drink alcohol. CSW provided mother with [facility] outpatient substance abuse resource list including [place] and [employer]. CSW made mother and father aware that a child protective services (CPS) report will have to be made due to infant's UDS being positive for marijuana. Mother and father verbalized their understanding and reported that they have no other needs or concerns.   CSW made a child protective services report in [facility] today. Please reconsult if future social work needs arise. CSW signing off.   CSW Plan/Description:  Child Protective Service Report     [name], [name] 02/06/2019, 11:07 AM

## 2019-02-09 NOTE — Progress Notes (Addendum)
8/24: Clinical Social Worker (CSW) contacted [place] child protective services (CPS) intake and spoke to Kenneth. CSW made Kenneth aware that infant's cord tissue screen is positive for THC. Per Kenneth patient's assigned CPS worker is Aiehsa Hall [phone] office, [phone] and he will make her and her supervisor aware of above. CSW also attempted to contact Aiesha however she did not answer and a voicemail was left.  CSW received a call back from CPS worker Aiesha and made her aware of above.   Christabell Loseke, LCSW [phone]  

## 2019-02-15 IMAGING — CR DG CHEST 2V
2 series · 2 of 2 positions shown · non-contrast
Comparison: August 08, 2017

CLINICAL DATA: Shortness of breath

EXAM:
CHEST - 2 VIEW

[chest pa]
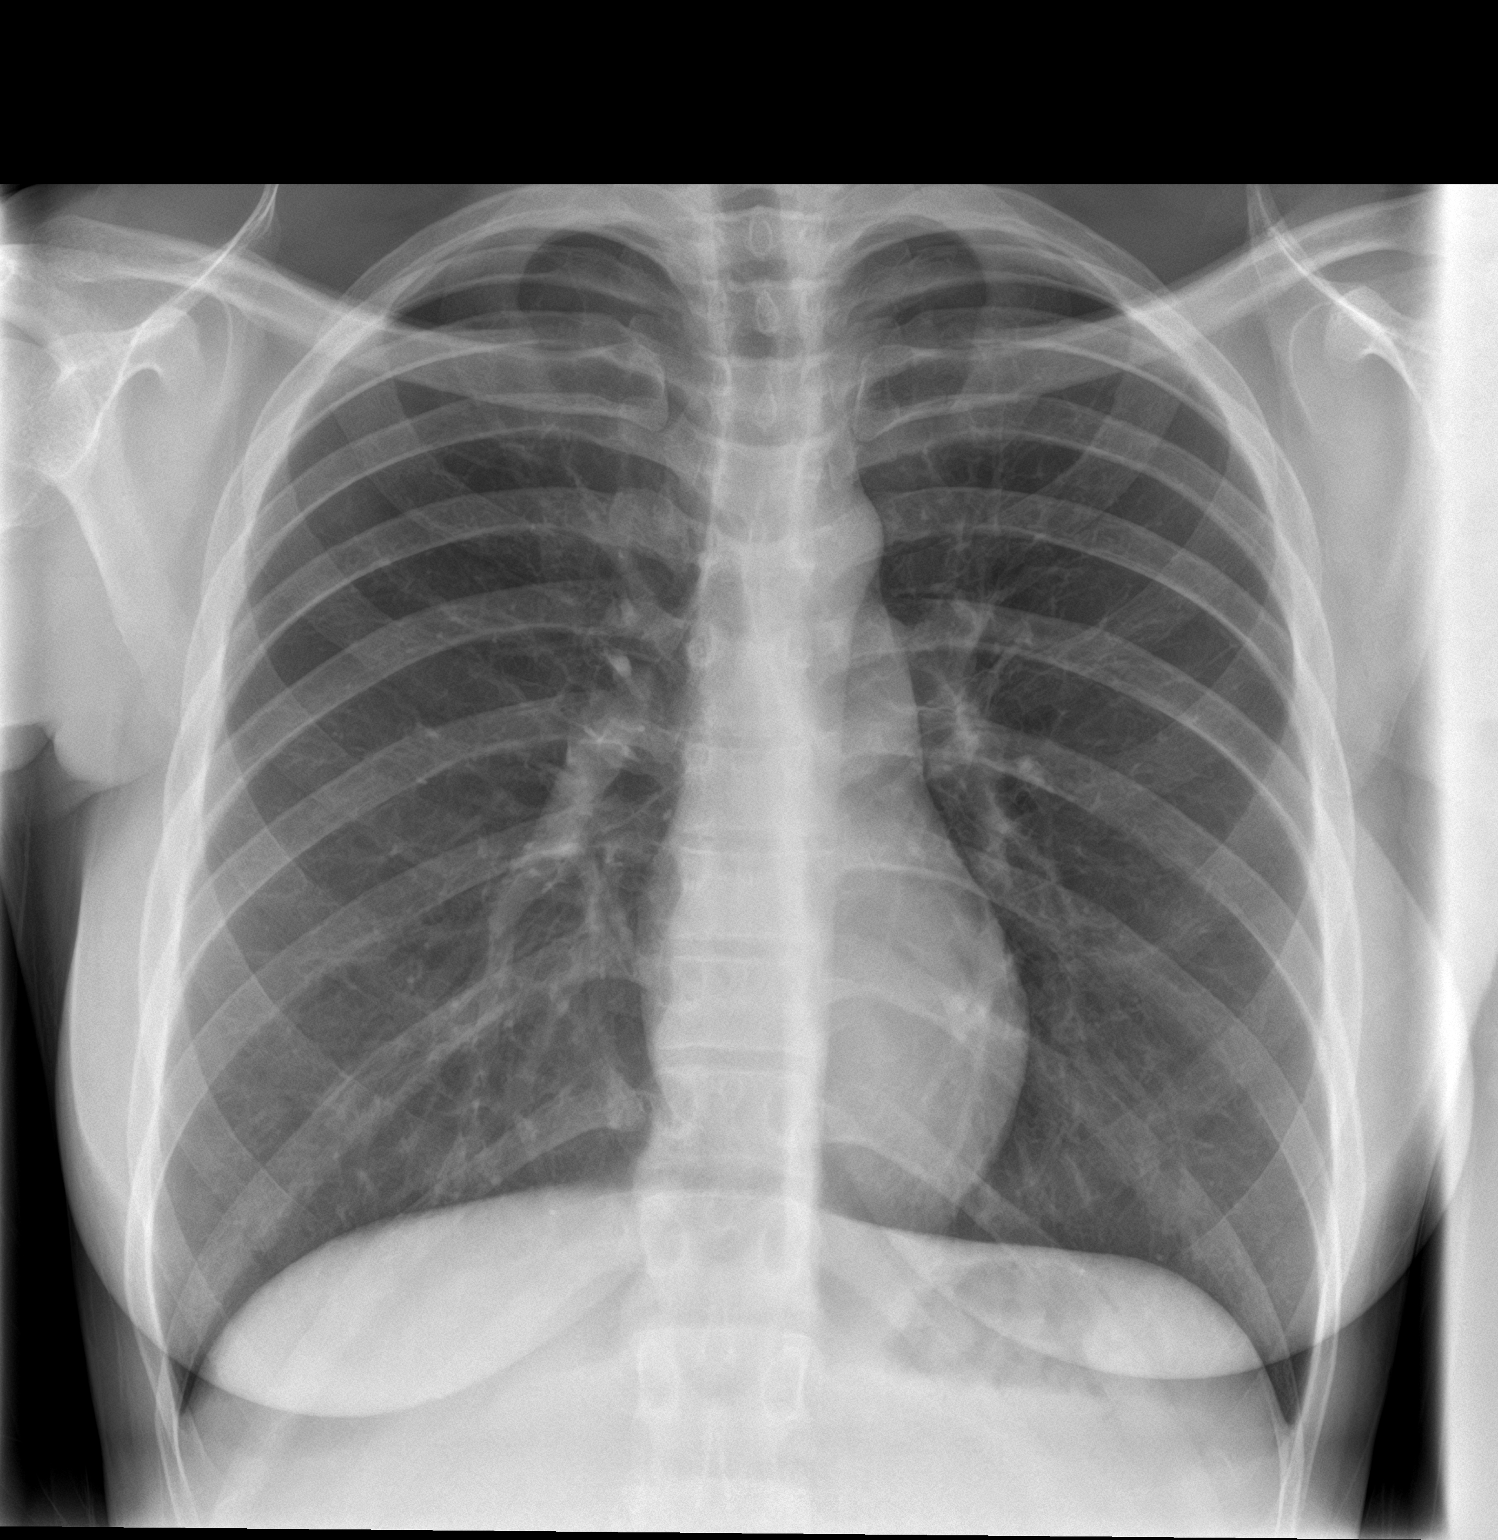

[chest lat]
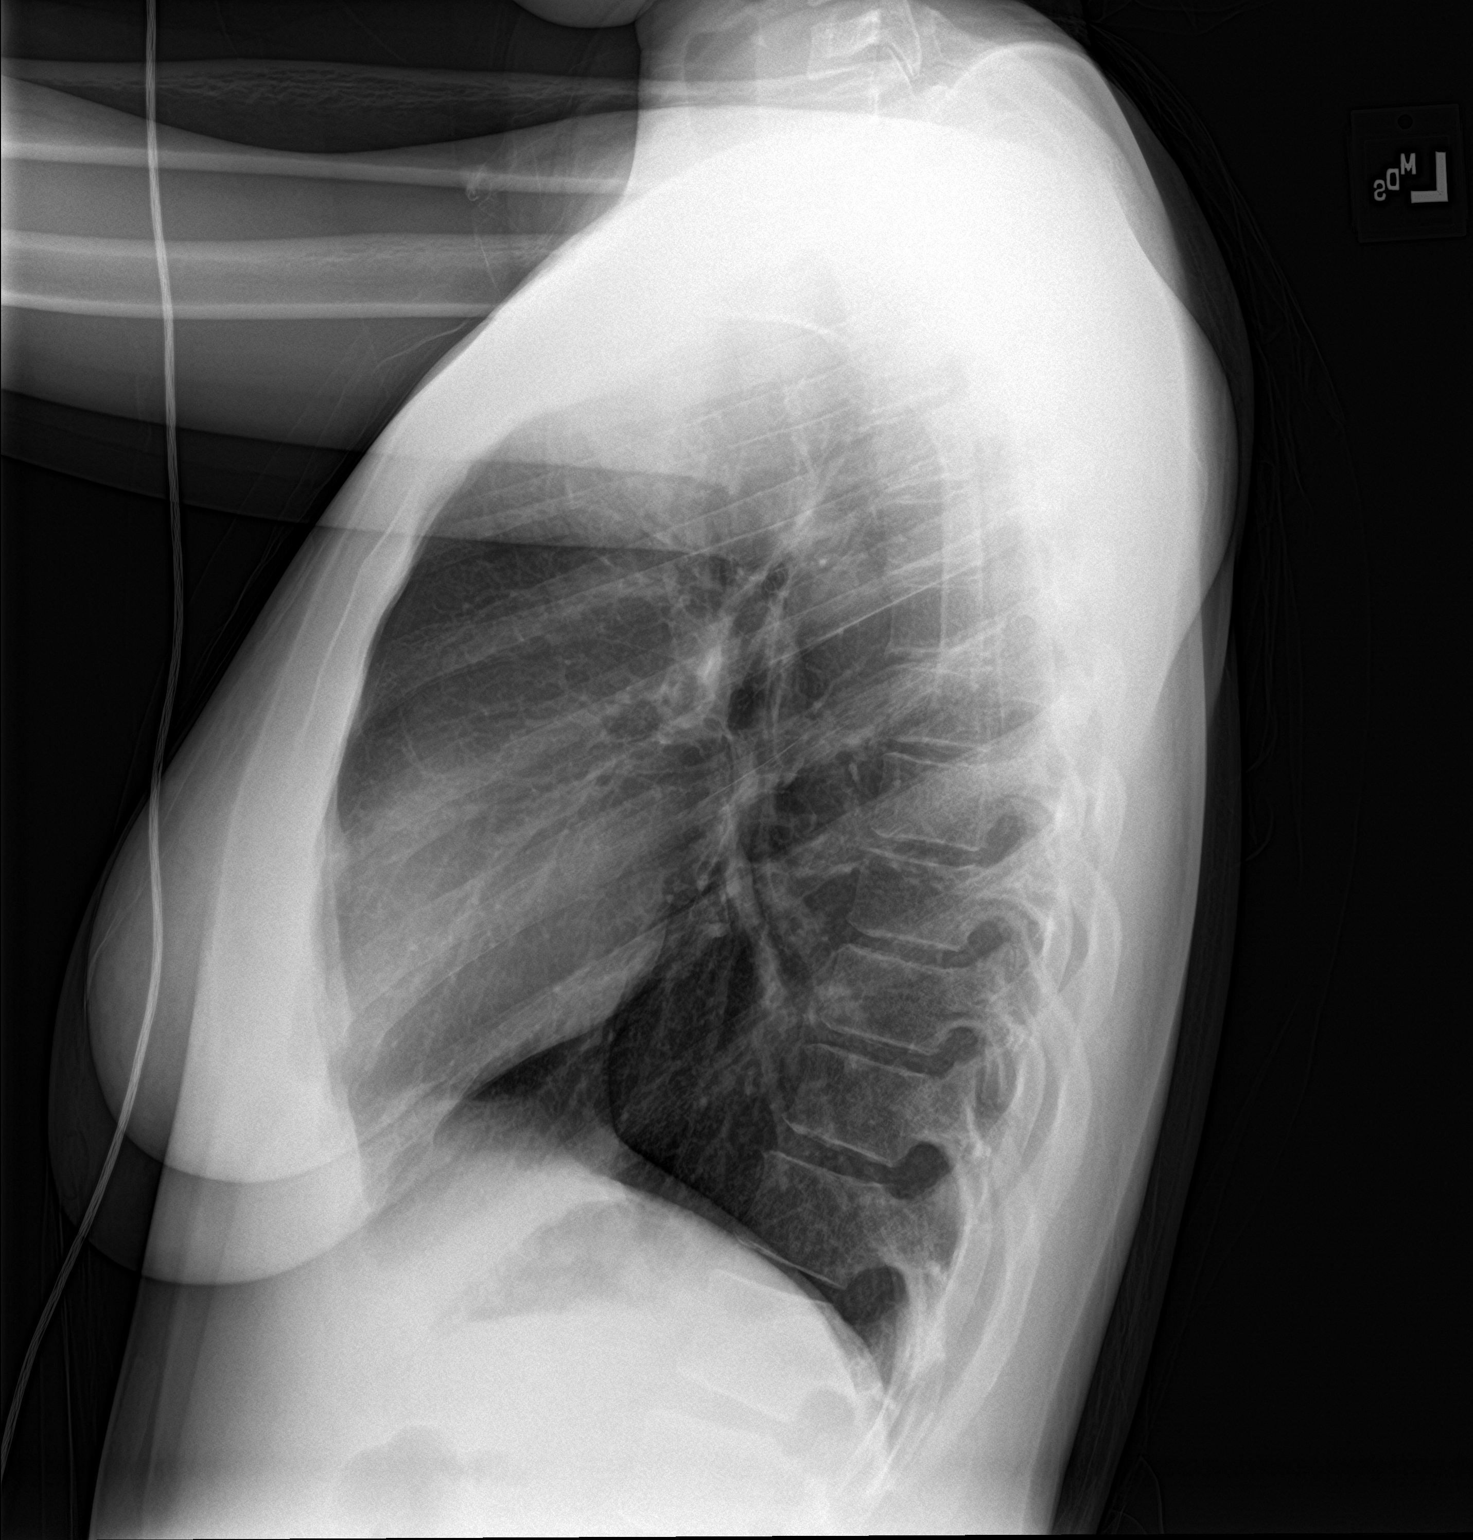

[2 of 2 positions shown; findings below may reference images not displayed]

FINDINGS: Lungs are clear. Heart size and pulmonary vascularity are normal. No
adenopathy. No bone lesions.
IMPRESSION: No edema or consolidation.

## 2019-02-25 IMAGING — US US ABDOMEN LIMITED
1 series · 14 of 25 positions shown · non-contrast
Comparison: None.

CLINICAL DATA: Six weeks of abdominal pain affecting pregnancy.

EXAM:
ULTRASOUND ABDOMEN LIMITED RIGHT UPPER QUADRANT

[Series 1: us abdomen limited · 14 of 51 slices shown]
[im 1/51]
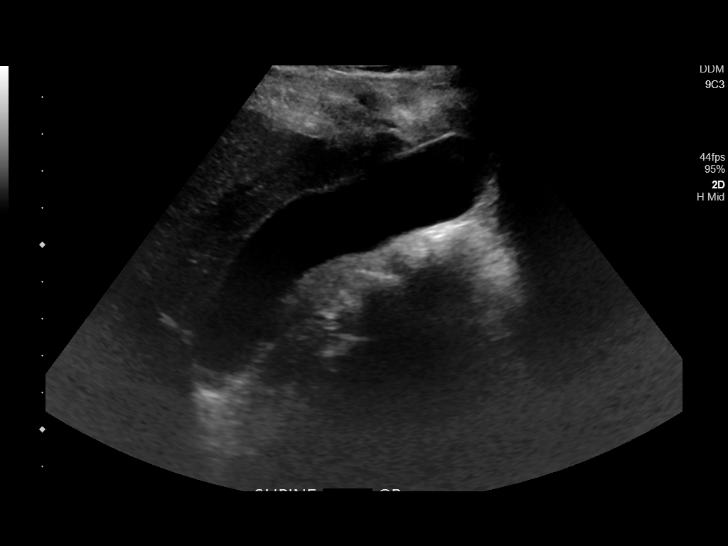
[im 5/51]
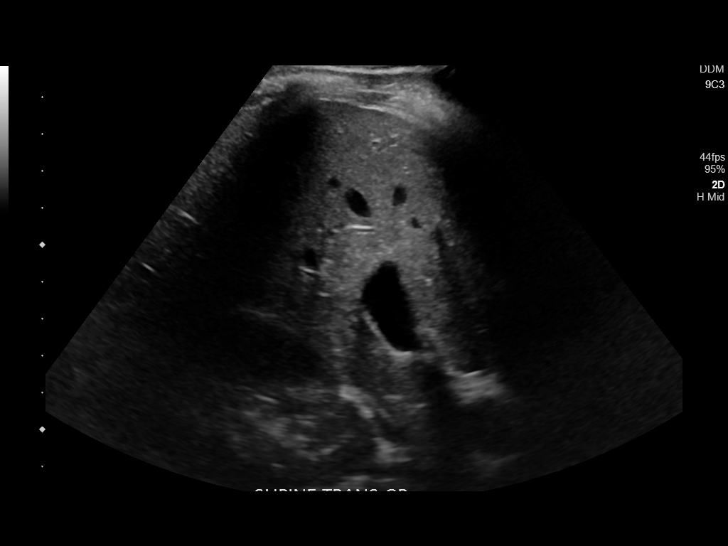
[im 9/51]
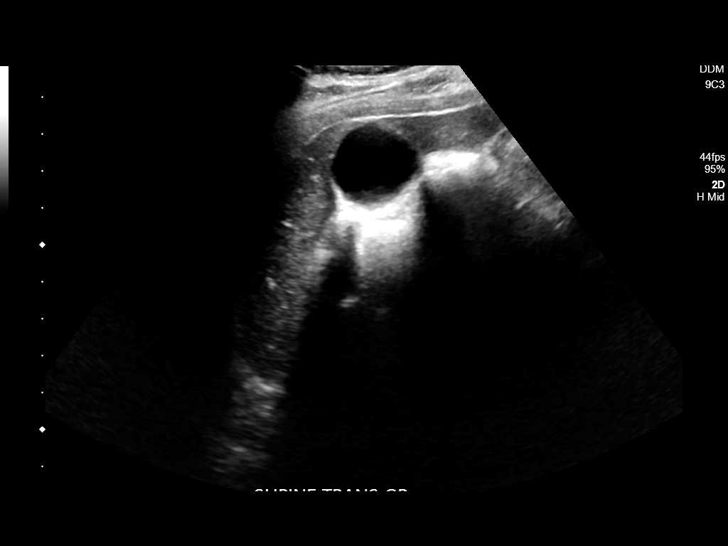
[im 13/51]
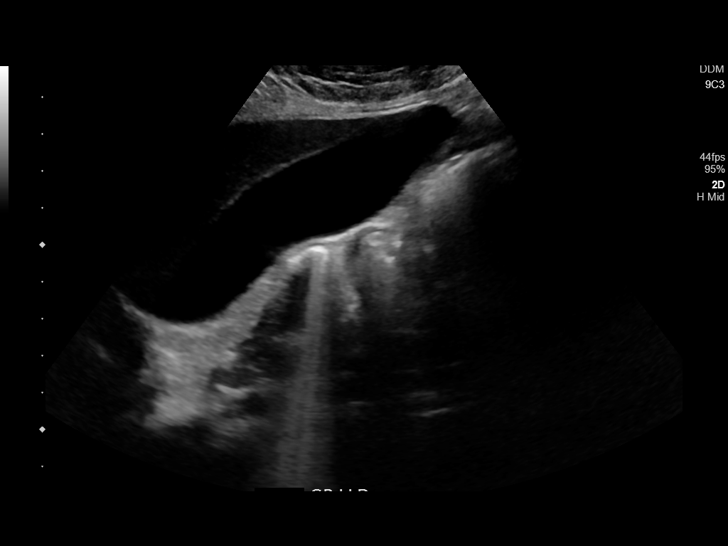
[im 17/51]
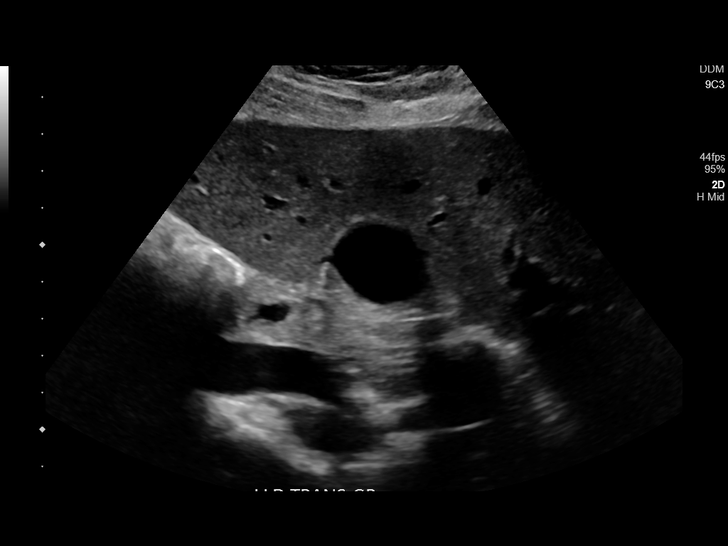
[im 19/51]
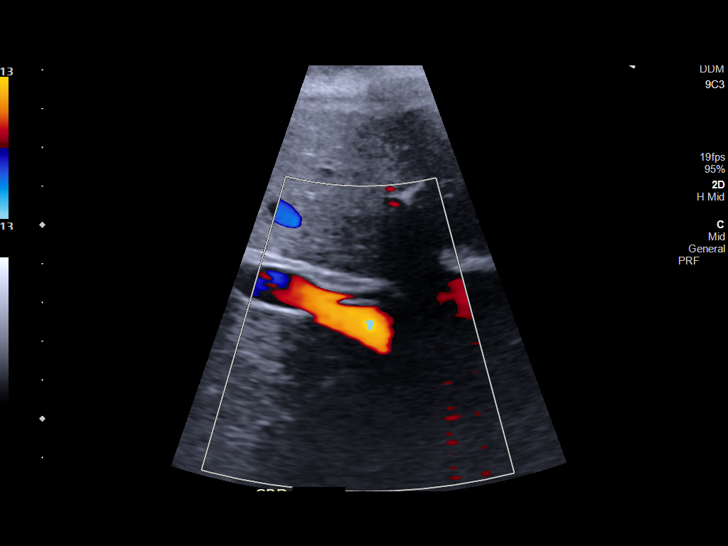
[im 23/51]
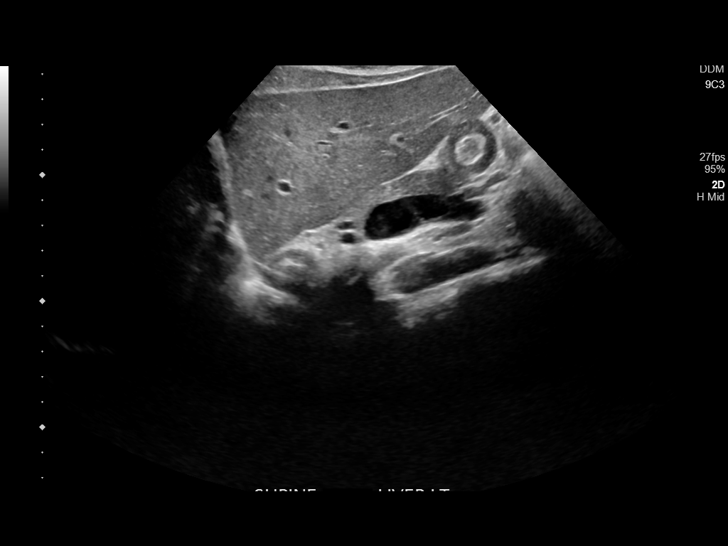
[im 28/51]
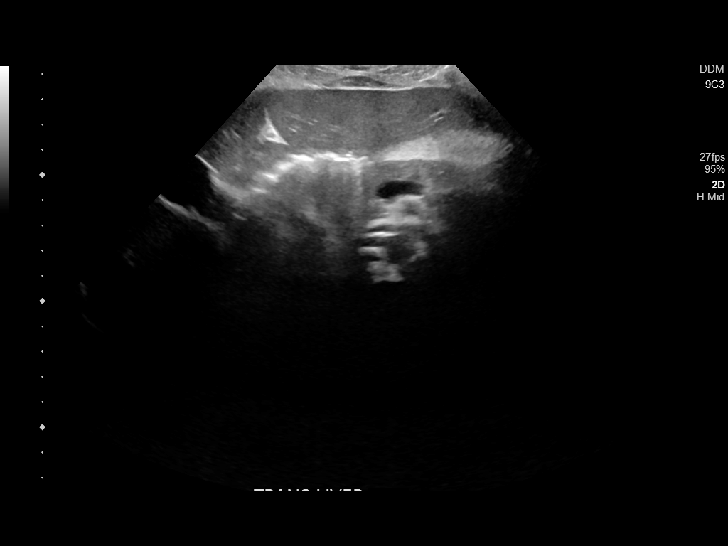
[im 32/51]
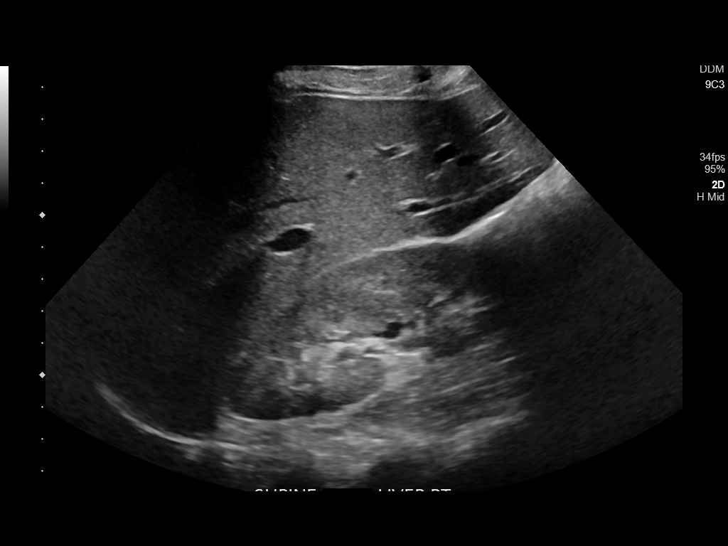
[im 34/51]
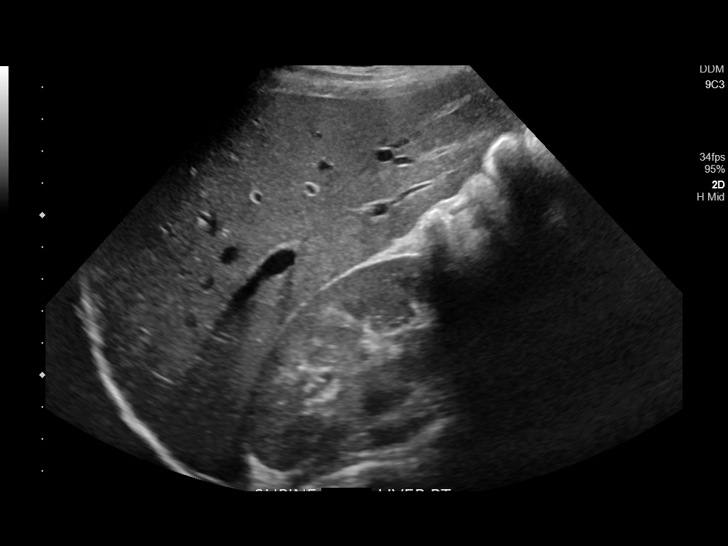
[im 38/51]
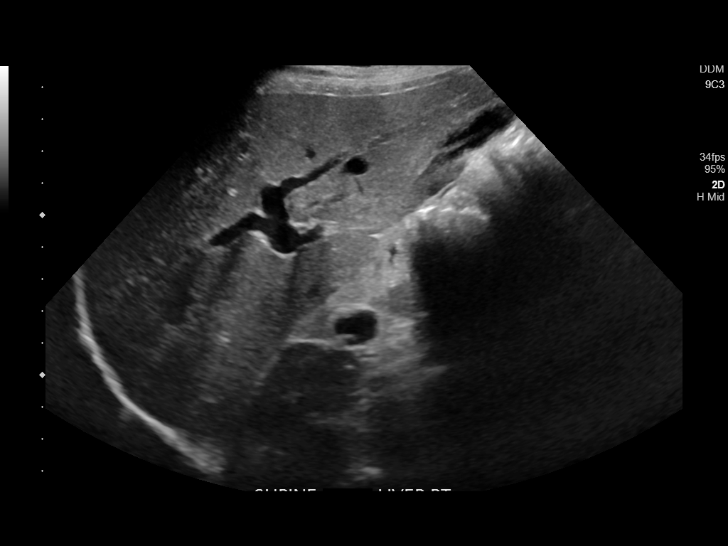
[im 42/51]
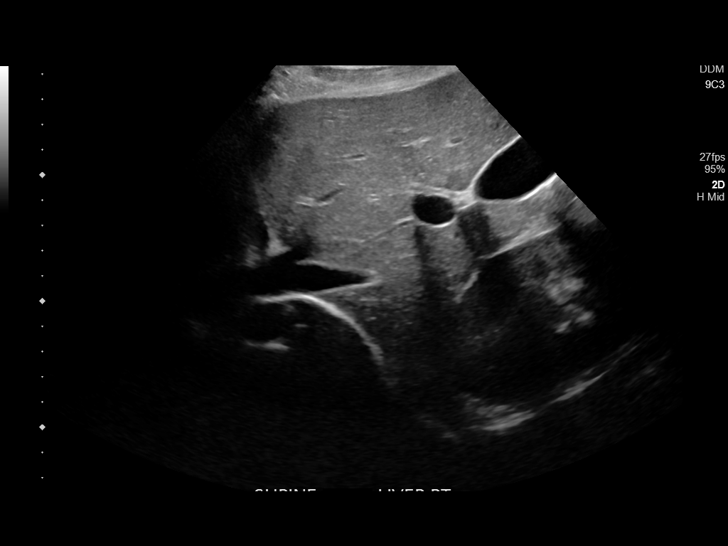
[im 46/51]
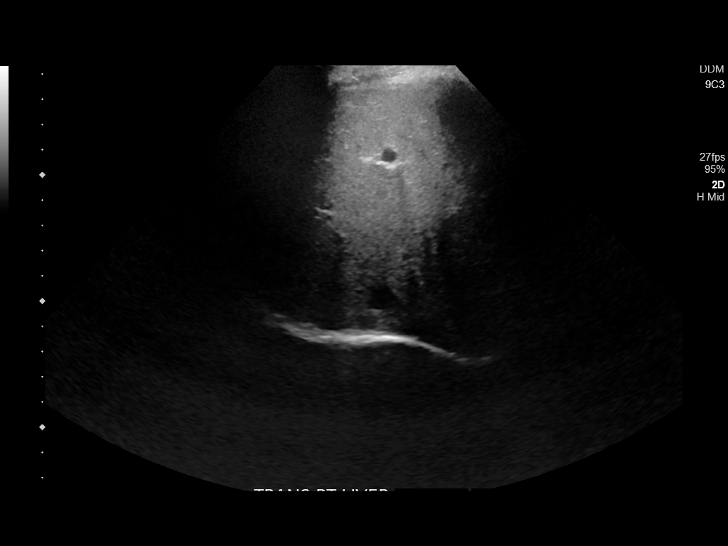
[im 51/51]
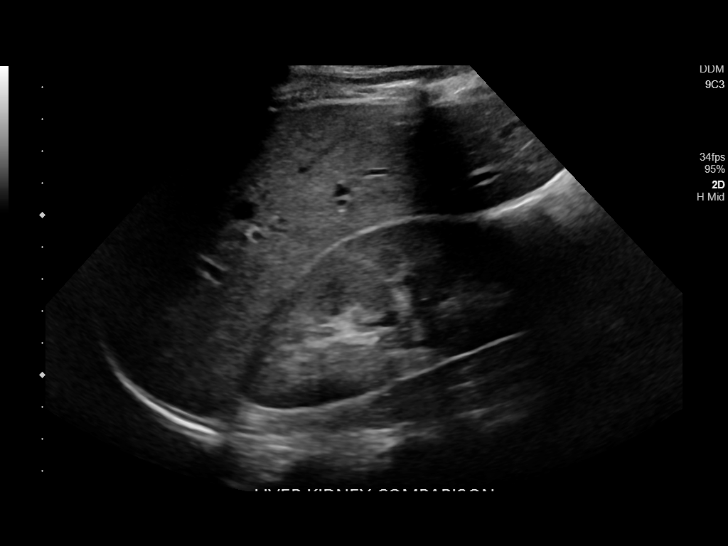

[14 of 25 positions shown; findings below may reference images not displayed]

FINDINGS: Gallbladder:

Normal sonographic appearance of the gallbladder. No gallbladder
wall thickening or pericholecystic fluid. No echogenic stones or
biliary sludge. Negative sonographic Murphy's sign.

Common bile duct:

Diameter: Normal in size measuring 2.6 mm in diameter

Liver:

Normal sonographic appearance of the liver. No discrete hepatic
lesions. No intrahepatic biliary ductal dilatation. No ascites.
Portal vein is patent on color Doppler imaging with normal direction
of blood flow towards the liver.
IMPRESSION: No explanation for patient's abdominal pain. Specifically, no
evidence of cholelithiasis/cholecystitis.

## 2019-03-19 ENCOUNTER — Other Ambulatory Visit: Payer: Self-pay

## 2019-03-19 ENCOUNTER — Encounter: Payer: Self-pay | Admitting: Obstetrics and Gynecology

## 2019-03-19 ENCOUNTER — Ambulatory Visit (INDEPENDENT_AMBULATORY_CARE_PROVIDER_SITE_OTHER): Payer: BC Managed Care – PPO | Admitting: Obstetrics and Gynecology

## 2019-03-19 DIAGNOSIS — Z3009 Encounter for other general counseling and advice on contraception: Secondary | ICD-10-CM

## 2019-03-19 DIAGNOSIS — I10 Essential (primary) hypertension: Secondary | ICD-10-CM

## 2019-03-19 DIAGNOSIS — Z3042 Encounter for surveillance of injectable contraceptive: Secondary | ICD-10-CM

## 2019-03-19 MED ORDER — MEDROXYPROGESTERONE ACETATE 150 MG/ML IM SUSP: 150.0000 mg | INTRAMUSCULAR | 0 refills | Status: DC

## 2019-03-19 MED ORDER — NIFEDIPINE ER OSMOTIC RELEASE 30 MG PO TB24: 30.0000 mg | ORAL_TABLET | Freq: Every day | ORAL | 2 refills | Status: AC

## 2019-03-19 MED ORDER — MEDROXYPROGESTERONE ACETATE 150 MG/ML IM SUSP
150.0000 mg | Freq: Once | INTRAMUSCULAR | Status: AC
  Administered 2019-03-19: 13:00:00 150 mg via INTRAMUSCULAR

## 2019-03-19 MED ORDER — MEDROXYPROGESTERONE ACETATE 150 MG/ML IM SUSP: 150.0000 mg | INTRAMUSCULAR | 3 refills | Status: AC

## 2019-03-19 NOTE — Progress Notes (Signed)
HPI:      [name] is a 28 y.o. [id number] who LMP was Patient's last menstrual period was 04/15/2018 (lmp unknown).  Subjective:   She presents today for postpartum visit.  She complains of intermittent headaches not relieved with Tylenol.  Also complains that she continues to have vaginal bleeding postpartum.  She has not resumed intercourse.  She desires Depo-Provera for birth control.  She is not breast-feeding. She states she is eating daily but she does not have much of an appetite.    Hx: The following portions of the patient's history were reviewed and updated as appropriate:             She  has a past medical history of History of pre-eclampsia in prior pregnancy, currently pregnant and Ovarian cyst. She does not have any pertinent problems on file. She  has a past surgical history that includes Cesarean section (07/07/2007). Her family history includes Healthy in her father and mother. She  reports that she quit smoking about 10 months ago. Her smoking use included cigarettes. She smoked 2.00 packs per day. She has never used smokeless tobacco. She reports that she does not drink alcohol or use drugs. She has a current medication list which includes the following prescription(s): albuterol sulfate, medroxyprogesterone, nifedipine, and prenatal gummies/dha & fa. She has No Known Allergies.       Review of Systems:  Review of Systems  Constitutional: Denied constitutional symptoms, night sweats, recent illness, fatigue, fever, insomnia and weight loss.  Eyes: Denied eye symptoms, eye pain, photophobia, vision change and visual disturbance.  Ears/Nose/Throat/Neck: Denied ear, nose, throat or neck symptoms, hearing loss, nasal discharge, sinus congestion and sore throat.  Cardiovascular: Denied cardiovascular symptoms, arrhythmia, chest pain/pressure, edema, exercise intolerance, orthopnea and palpitations.  Respiratory: Denied pulmonary symptoms, asthma, pleuritic pain,  productive sputum, cough, dyspnea and wheezing.  Gastrointestinal: Denied, gastro-esophageal reflux, melena, nausea and vomiting.  Genitourinary: Denied genitourinary symptoms including symptomatic vaginal discharge, pelvic relaxation issues, and urinary complaints.  Musculoskeletal: Denied musculoskeletal symptoms, stiffness, swelling, muscle weakness and myalgia.  Dermatologic: Denied dermatology symptoms, rash and scar.  Neurologic:  Patient complains of almost daily headaches.  Psychiatric: Denied psychiatric symptoms, anxiety and depression.  Endocrine: Denied endocrine symptoms including hot flashes and night sweats.   Meds:   Current Outpatient Medications on File Prior to Visit  Medication Sig Dispense Refill  . Albuterol Sulfate (PROAIR RESPICLICK) 108 (90 Base) MCG/ACT AEPB Inhale 2 puffs into the lungs 3 (three) times daily as needed. (Patient not taking: Reported on 03/19/2019) 1 each 0  . Prenatal MV-Min-FA-Omega-3 (PRENATAL GUMMIES/DHA & FA) 0.4-32.5 MG CHEW Chew 0.4 mg by mouth daily. (Patient not taking: Reported on 03/19/2019) 30 tablet 11   No current facility-administered medications on file prior to visit.     Objective:     Vitals:   03/19/19 1123  BP: (!) 145/105  Pulse: 80              Patient declined postpartum pelvic examination because of her "heavy bleeding".  Assessment:    [id number] Patient Active Problem List   Diagnosis Date Noted  . Labor and delivery, indication for care 02/05/2019  . Indication for care in labor or delivery 02/04/2019  . Decreased fetal movement affecting management of pregnancy in third trimester 01/29/2019  . Abdominal pain affecting pregnancy 01/29/2019  . Abdominal pain in pregnancy, third trimester 01/29/2019  . Desires VBAC (vaginal birth after cesarean) trial 11/28/2018  . History of preterm  delivery 11/28/2018  . History of pre-eclampsia in prior pregnancy, currently pregnant 08/19/2018  . History of cesarean delivery  08/19/2018  . Chronic pelvic pain in female 02/11/2013     1. Postpartum care and examination immediately after delivery   2. Hypertension, unspecified type   3. Birth control counseling   4. Encounter for management and injection of depo-Provera     Patient with hypertension which may be contributing to her headaches.  Normal postpartum bleeding.   Plan:            1.  We will begin Procardia XL 30 mg daily for blood pressure  2.  Increase p.o. fluid intake encouraged  3.  Begin Depo-Provera for birth control-expect this to contribute to rapid reduction in vaginal bleeding.  4.  Follow-up in 2 weeks for blood pressure check and to further check on patient's bleeding. Orders No orders of the defined types were placed in this encounter.    Meds ordered this encounter  Medications  . DISCONTD: medroxyPROGESTERone (DEPO-PROVERA) 150 MG/ML injection    Sig: Inject 1 mL (150 mg total) into the muscle every 3 (three) months.    Dispense:  1 mL    Refill:  0  . NIFEdipine (PROCARDIA-XL/NIFEDICAL-XL) 30 MG 24 hr tablet    Sig: Take 1 tablet (30 mg total) by mouth daily. Can increase to twice a day as needed for symptomatic contractions    Dispense:  30 tablet    Refill:  2  . medroxyPROGESTERone (DEPO-PROVERA) injection 150 mg  . medroxyPROGESTERone (DEPO-PROVERA) 150 MG/ML injection    Sig: Inject 1 mL (150 mg total) into the muscle every 3 (three) months.    Dispense:  1 mL    Refill:  3      F/U  Return in about 2 weeks (around 04/02/2019).  Finis Bud, M.D. 03/19/2019 3:25 PM

## 2019-03-19 NOTE — Progress Notes (Signed)
Patient comes in today for 6 week PPV. She would like to try the Depo injection for [facility]. She has not had intercourse since giving birth. She has been bleeding heavy since birth. Patient BP is elevated today. Patient stated that she has had headaches daily since giving birth and has dizzy spells some days as well.

## 2019-04-08 ENCOUNTER — Encounter: Payer: Self-pay | Admitting: Obstetrics and Gynecology

## 2019-04-08 ENCOUNTER — Other Ambulatory Visit: Payer: Self-pay

## 2019-04-08 ENCOUNTER — Ambulatory Visit (INDEPENDENT_AMBULATORY_CARE_PROVIDER_SITE_OTHER): Payer: BC Managed Care – PPO | Admitting: Obstetrics and Gynecology

## 2019-04-08 VITALS — BP 128/94 | HR 86 | Ht 69.0 in | Wt 136.8 lb

## 2019-04-08 DIAGNOSIS — R519 Headache, unspecified: Secondary | ICD-10-CM | POA: Diagnosis not present

## 2019-04-08 DIAGNOSIS — I10 Essential (primary) hypertension: Secondary | ICD-10-CM | POA: Diagnosis not present

## 2019-04-08 DIAGNOSIS — G8929 Other chronic pain: Secondary | ICD-10-CM

## 2019-04-08 MED ORDER — BUTALBITAL-APAP-CAFFEINE 50-325-40 MG PO CAPS: 1.0000 | ORAL_CAPSULE | Freq: Four times a day (QID) | ORAL | 0 refills | Status: DC | PRN

## 2019-04-08 MED ORDER — BUTALBITAL-APAP-CAFFEINE 50-325-40 MG PO CAPS: 1.0000 | ORAL_CAPSULE | Freq: Four times a day (QID) | ORAL | 0 refills | Status: AC | PRN

## 2019-04-08 NOTE — Progress Notes (Signed)
HPI:      [name] is a 28 y.o. [id number] who LMP was Patient's last menstrual period was 04/15/2018 (lmp unknown).  Subjective:   She presents today approximately 8 weeks postpartum.  She has been taking Procardia for hypertension.  She reports that she continues to experience headaches. She has no other complaints. She is bottlefeeding.    Hx: The following portions of the patient's history were reviewed and updated as appropriate:             She  has a past medical history of History of pre-eclampsia in prior pregnancy, currently pregnant and Ovarian cyst. She does not have any pertinent problems on file. She  has a past surgical history that includes Cesarean section (07/07/2007). Her family history includes Healthy in her father and mother. She  reports that she quit smoking about 11 months ago. Her smoking use included cigarettes. She smoked 2.00 packs per day. She has never used smokeless tobacco. She reports that she does not drink alcohol or use drugs. She has a current medication list which includes the following prescription(s): albuterol sulfate, medroxyprogesterone, nifedipine, and prenatal gummies/dha & fa. She has No Known Allergies.       Review of Systems:  Review of Systems  Constitutional: Denied constitutional symptoms, night sweats, recent illness, fatigue, fever, insomnia and weight loss.  Eyes: Denied eye symptoms, eye pain, photophobia, vision change and visual disturbance.  Ears/Nose/Throat/Neck: Denied ear, nose, throat or neck symptoms, hearing loss, nasal discharge, sinus congestion and sore throat.  Cardiovascular: Denied cardiovascular symptoms, arrhythmia, chest pain/pressure, edema, exercise intolerance, orthopnea and palpitations.  Respiratory: Denied pulmonary symptoms, asthma, pleuritic pain, productive sputum, cough, dyspnea and wheezing.  Gastrointestinal: Denied, gastro-esophageal reflux, melena, nausea and vomiting.  Genitourinary: Denied  genitourinary symptoms including symptomatic vaginal discharge, pelvic relaxation issues, and urinary complaints.  Musculoskeletal: Denied musculoskeletal symptoms, stiffness, swelling, muscle weakness and myalgia.  Dermatologic: Denied dermatology symptoms, rash and scar.  Neurologic: See HPI for additional information.  Psychiatric: Denied psychiatric symptoms, anxiety and depression.  Endocrine: Denied endocrine symptoms including hot flashes and night sweats.   Meds:   Current Outpatient Medications on File Prior to Visit  Medication Sig Dispense Refill  . Albuterol Sulfate (PROAIR RESPICLICK) 124 (90 Base) MCG/ACT AEPB Inhale 2 puffs into the lungs 3 (three) times daily as needed. 1 each 0  . medroxyPROGESTERone (DEPO-PROVERA) 150 MG/ML injection Inject 1 mL (150 mg total) into the muscle every 3 (three) months. 1 mL 3  . NIFEdipine (PROCARDIA-XL/NIFEDICAL-XL) 30 MG 24 hr tablet Take 1 tablet (30 mg total) by mouth daily. Can increase to twice a day as needed for symptomatic contractions 30 tablet 2  . Prenatal MV-Min-FA-Omega-3 (PRENATAL GUMMIES/DHA & FA) 0.4-32.5 MG CHEW Chew 0.4 mg by mouth daily. 30 tablet 11   No current facility-administered medications on file prior to visit.     Objective:     Vitals:   04/08/19 1030  BP: (!) 128/94  Pulse: 86                Assessment:    G2P2002 Patient Active Problem List   Diagnosis Date Noted  . Labor and delivery, indication for care 02/05/2019  . Indication for care in labor or delivery 02/04/2019  . Decreased fetal movement affecting management of pregnancy in third trimester 01/29/2019  . Abdominal pain affecting pregnancy 01/29/2019  . Abdominal pain in pregnancy, third trimester 01/29/2019  . Desires VBAC (vaginal birth after cesarean) trial 11/28/2018  .  History of preterm delivery 11/28/2018  . History of pre-eclampsia in prior pregnancy, currently pregnant 08/19/2018  . History of cesarean delivery 08/19/2018  .  Chronic pelvic pain in female 02/11/2013     1. Hypertension, unspecified type   2. Chronic nonintractable headache, unspecified headache type     Patient continues to experience mild hypertension despite use of Procardia.   Plan:            1.  We will continue Procardia for 4 weeks.  If patient has no further hypertension consider discontinuation Procardia.  If mild hypertension continues consider referral for chronic use of an antihypertensive medication.  2.  Will try Fioricet for headaches. Orders No orders of the defined types were placed in this encounter.   No orders of the defined types were placed in this encounter.     F/U  Return in about 4 weeks (around 05/06/2019). I spent 16 minutes involved in the care of this patient of which greater than 50% was spent discussing headaches, hypertension, strategies for increased hydration and use of Fioricet, possible future treatment long-term for chronic hypertension.  All questions answered.  Elonda Husky, M.D. 04/08/2019 11:04 AM

## 2019-04-08 NOTE — Progress Notes (Signed)
Patient comes in today for 2 week follow up for BP. She is still having the frequent headaches.

## 2019-04-08 NOTE — Addendum Note (Signed)
Addended by: Finis Bud on: 04/08/2019 11:40 AM   Modules accepted: Orders

## 2019-05-06 ENCOUNTER — Ambulatory Visit (INDEPENDENT_AMBULATORY_CARE_PROVIDER_SITE_OTHER): Payer: BC Managed Care – PPO | Admitting: Obstetrics and Gynecology

## 2019-05-06 ENCOUNTER — Other Ambulatory Visit: Payer: Self-pay

## 2019-05-06 ENCOUNTER — Encounter: Payer: Self-pay | Admitting: Obstetrics and Gynecology

## 2019-05-06 VITALS — BP 131/86 | HR 87 | Wt 136.3 lb

## 2019-05-06 DIAGNOSIS — I10 Essential (primary) hypertension: Secondary | ICD-10-CM | POA: Diagnosis not present

## 2019-05-06 NOTE — Progress Notes (Signed)
HPI:      Caitlin [name] is a 28 y.o. [phone] who LMP was No LMP recorded. Patient has had an injection.  Subjective:   She presents today for follow-up of her blood pressure.  She continues to take Procardia.  She says that her headaches have improved. She does complain that she still has daily spotting-using Depo-Provera. She is bottlefeeding.    Hx: The following portions of the patient's history were reviewed and updated as appropriate:             She  has a past medical history of History of pre-eclampsia in prior pregnancy, currently pregnant and Ovarian cyst. She does not have any pertinent problems on file. She  has a past surgical history that includes Cesarean section (07/07/2007). Her family history includes Healthy in her father and mother. She  reports that she quit smoking about a year ago. Her smoking use included cigarettes. She smoked 2.00 packs per day. She has never used smokeless tobacco. She reports that she does not drink alcohol or use drugs. She has a current medication list which includes the following prescription(s): albuterol sulfate, butalbital-apap-caffeine, medroxyprogesterone, nifedipine, and prenatal gummies/dha & fa. She has No Known Allergies.       Review of Systems:  Review of Systems  Constitutional: Denied constitutional symptoms, night sweats, recent illness, fatigue, fever, insomnia and weight loss.  Eyes: Denied eye symptoms, eye pain, photophobia, vision change and visual disturbance.  Ears/Nose/Throat/Neck: Denied ear, nose, throat or neck symptoms, hearing loss, nasal discharge, sinus congestion and sore throat.  Cardiovascular: Denied cardiovascular symptoms, arrhythmia, chest pain/pressure, edema, exercise intolerance, orthopnea and palpitations.  Respiratory: Denied pulmonary symptoms, asthma, pleuritic pain, productive sputum, cough, dyspnea and wheezing.  Gastrointestinal: Denied, gastro-esophageal reflux, melena, nausea and vomiting.   Genitourinary: Denied genitourinary symptoms including symptomatic vaginal discharge, pelvic relaxation issues, and urinary complaints.  Musculoskeletal: Denied musculoskeletal symptoms, stiffness, swelling, muscle weakness and myalgia.  Dermatologic: Denied dermatology symptoms, rash and scar.  Neurologic: Denied neurology symptoms, dizziness, headache, neck pain and syncope.  Psychiatric: Denied psychiatric symptoms, anxiety and depression.  Endocrine: Denied endocrine symptoms including hot flashes and night sweats.   Meds:   Current Outpatient Medications on File Prior to Visit  Medication Sig Dispense Refill  . Albuterol Sulfate (PROAIR RESPICLICK) 108 (90 Base) MCG/ACT AEPB Inhale 2 puffs into the lungs 3 (three) times daily as needed. 1 each 0  . Butalbital-APAP-Caffeine 50-325-40 MG capsule Take 1 capsule by mouth every 6 (six) hours as needed for headache. 30 capsule 0  . medroxyPROGESTERone (DEPO-PROVERA) 150 MG/ML injection Inject 1 mL (150 mg total) into the muscle every 3 (three) months. 1 mL 3  . NIFEdipine (PROCARDIA-XL/NIFEDICAL-XL) 30 MG 24 hr tablet Take 1 tablet (30 mg total) by mouth daily. Can increase to twice a day as needed for symptomatic contractions 30 tablet 2  . Prenatal MV-Min-FA-Omega-3 (PRENATAL GUMMIES/DHA & FA) 0.4-32.5 MG CHEW Chew 0.4 mg by mouth daily. 30 tablet 11   No current facility-administered medications on file prior to visit.     Objective:     Vitals:   05/06/19 0959  BP: 131/86  Pulse: 87                Assessment:    G2P2002 Patient Active Problem List   Diagnosis Date Noted  . Labor and delivery, indication for care 02/05/2019  . Indication for care in labor or delivery 02/04/2019  . Decreased fetal movement affecting management of pregnancy in  third trimester 01/29/2019  . Abdominal pain affecting pregnancy 01/29/2019  . Abdominal pain in pregnancy, third trimester 01/29/2019  . Desires VBAC (vaginal birth after cesarean)  trial 11/28/2018  . History of preterm delivery 11/28/2018  . History of pre-eclampsia in prior pregnancy, currently pregnant 08/19/2018  . History of cesarean delivery 08/19/2018  . Chronic pelvic pain in female 02/11/2013     1. Hypertension, unspecified type     Blood pressure currently controlled with Procardia but likely not the best long-term medication for this patient.  I believe that she will likely require antihypertensives and I would prefer that she see a PCP for deciding which long-term medication would be most appropriate for her.  Possibly even a trial off medication would be in order prior to beginning.   Plan:            1.  Refer to primary care for likely long-term antihypertensive therapy and decision-making process.  2.  Reassured patient regarding continued spotting on Depo.  Expect resolution by second Depo injection. Orders Orders Placed This Encounter  Procedures  . Ambulatory referral to [facility]    No orders of the defined types were placed in this encounter.     F/U  No follow-ups on file. I spent 17 minutes involved in the care of this patient of which greater than 50% was spent discussing blood pressure issues, Depo-Provera and breakthrough bleeding, referral to PCP, previous headaches.  All questions answered.  Finis Bud, M.D. 05/06/2019 10:29 AM

## 2019-06-04 ENCOUNTER — Ambulatory Visit (INDEPENDENT_AMBULATORY_CARE_PROVIDER_SITE_OTHER): Payer: BC Managed Care – PPO | Admitting: Obstetrics and Gynecology

## 2019-06-04 ENCOUNTER — Other Ambulatory Visit: Payer: Self-pay

## 2019-06-04 VITALS — BP 113/85 | HR 75 | Ht 69.0 in | Wt 136.3 lb

## 2019-06-04 DIAGNOSIS — Z3042 Encounter for surveillance of injectable contraceptive: Secondary | ICD-10-CM

## 2019-06-04 MED ORDER — MEDROXYPROGESTERONE ACETATE 150 MG/ML IM SUSP
150.0000 mg | Freq: Once | INTRAMUSCULAR | Status: AC
  Administered 2019-06-04: 10:00:00 150 mg via INTRAMUSCULAR

## 2019-06-04 NOTE — Progress Notes (Signed)
Date last pap: 07/22/2018 (Negative). Last Depo-Provera: 03/19/2019. Side Effects if any: None per patient. Serum HCG indicated? N/A, patient is within dates. Depo-Provera 150 mg IM given by: Jennye Moccasin, CMA. Next appointment due 3/4-3/18/2021.  BP 113/85   Pulse 75   Ht 5\' 9"  (1.753 m)   Wt 136 lb 4.8 oz (61.8 kg)   LMP  (LMP Unknown)   BMI 20.13 kg/m

## 2019-08-24 ENCOUNTER — Other Ambulatory Visit: Payer: Self-pay

## 2019-08-24 ENCOUNTER — Ambulatory Visit (INDEPENDENT_AMBULATORY_CARE_PROVIDER_SITE_OTHER): Payer: BC Managed Care – PPO | Admitting: Surgical

## 2019-08-24 VITALS — BP 142/105 | HR 95 | Ht 69.0 in | Wt 131.5 lb

## 2019-08-24 DIAGNOSIS — Z3042 Encounter for surveillance of injectable contraceptive: Secondary | ICD-10-CM

## 2019-08-24 MED ORDER — MEDROXYPROGESTERONE ACETATE 150 MG/ML IM SUSP
150.0000 mg | Freq: Once | INTRAMUSCULAR | Status: AC
  Administered 2019-08-24: 150 mg via INTRAMUSCULAR

## 2019-08-24 NOTE — Progress Notes (Signed)
Date last pap: NA Last Depo-Provera: 06/04/19 Side Effects if any: NA Serum HCG indicated? NA Depo-Provera 150 mg IM given by: [facility] CMA Next appointment due 11/09/19-11/23/19  Today's Vitals   08/24/19 1549  BP: (!) 142/105  Pulse: 95  Weight: 131 lb 8 oz (59.6 kg)  Height: 5\' 9"  (1.753 m)   Body mass index is 19.42 kg/m.

## 2019-08-25 ENCOUNTER — Telehealth: Payer: Self-pay | Admitting: Family

## 2019-08-25 ENCOUNTER — Ambulatory Visit
Admission: EM | Admit: 2019-08-25 | Discharge: 2019-08-25 | Disposition: A | Discharge status: Home / Self Care | Payer: BC Managed Care – PPO

## 2019-08-25 ENCOUNTER — Other Ambulatory Visit: Payer: Self-pay

## 2019-08-25 DIAGNOSIS — R42 Dizziness and giddiness: Secondary | ICD-10-CM

## 2019-08-25 DIAGNOSIS — I10 Essential (primary) hypertension: Secondary | ICD-10-CM

## 2019-08-25 NOTE — Telephone Encounter (Signed)
Symptoms are quite concerning , feeling like she will blackout, headache, BP while she was in Dr Logan Bores office.  Is she taking the procardia 30mg  qd?    she needs an in person evaluation asap.    Please advise urgent care across the street for evaluation of dizziness, HA TODAY. That is not safe and Im worried about her leaving town.    You can still over the Friday appt in person but still she would need to be seen prior to that with her symptoms.   Laslty, we can see her on the 23rd but that is far too long to wait with her symptoms.

## 2019-08-25 NOTE — ED Provider Notes (Signed)
Caitlin [name]    CSN: [id number] Arrival date & time: 08/25/19  1105      History   Chief Complaint Chief Complaint  Patient presents with  . Hypertension  . Dizziness    HPI Caitlin [name] is a 29 y.o. female.   Patient presents with concern for elevated blood pressure at her OB/GYN's office yesterday.  She has an appointment to establish a new PCP later this month and they instructed her to come here for evaluation.  She also reports intermittent dizziness and headaches.  She has taken Tylenol for her symptoms.  She denies weakness, chest pain, shortness of breath, or other concerning symptoms.  The history is provided by the patient.    Past Medical History:  Diagnosis Date  . History of pre-eclampsia in prior pregnancy, currently pregnant   . Ovarian cyst     Patient Active Problem List   Diagnosis Date Noted  . Labor and delivery, indication for care 02/05/2019  . Indication for care in labor or delivery 02/04/2019  . Decreased fetal movement affecting management of pregnancy in third trimester 01/29/2019  . Abdominal pain affecting pregnancy 01/29/2019  . Abdominal pain in pregnancy, third trimester 01/29/2019  . Desires VBAC (vaginal birth after cesarean) trial 11/28/2018  . History of preterm delivery 11/28/2018  . History of pre-eclampsia in prior pregnancy, currently pregnant 08/19/2018  . History of cesarean delivery 08/19/2018  . Chronic pelvic pain in female 02/11/2013    Past Surgical History:  Procedure Laterality Date  . CESAREAN SECTION  07/07/2007    OB History    Gravida  2   Para  2   Term  2   Preterm      AB      Living  2     SAB      TAB      Ectopic      Multiple  0   Live Births  2            Home Medications    Prior to Admission medications   Medication Sig Start Date End Date Taking? Authorizing Provider  medroxyPROGESTERone (DEPO-PROVERA) 150 MG/ML injection Inject 1 mL (150 mg total) into the muscle  every 3 (three) months. 03/19/19  Yes Linzie Collin, MD  NIFEdipine (PROCARDIA-XL/NIFEDICAL-XL) 30 MG 24 hr tablet Take 1 tablet (30 mg total) by mouth daily. Can increase to twice a day as needed for symptomatic contractions 03/19/19  Yes Linzie Collin, MD  Albuterol Sulfate (PROAIR RESPICLICK) 108 (90 Base) MCG/ACT AEPB Inhale 2 puffs into the lungs 3 (three) times daily as needed. 07/29/18   Willy Eddy, MD  Butalbital-APAP-Caffeine [phone] MG capsule Take 1 capsule by mouth every 6 (six) hours as needed for headache. 04/08/19   Linzie Collin, MD    Family History Family History  Problem Relation Age of Onset  . Healthy Mother   . Healthy Father   . Breast cancer Neg Hx   . Ovarian cancer Neg Hx   . Colon cancer Neg Hx     Social History Social History   Tobacco Use  . Smoking status: Current Every Day Smoker    Packs/day: 0.50    Types: Cigarettes  . Smokeless tobacco: Never Used  Substance Use Topics  . Alcohol use: No  . Drug use: No     Allergies   Patient has no known allergies.   Review of Systems Review of Systems  Constitutional: Negative for chills and  fever.  HENT: Negative for congestion, ear pain, rhinorrhea and sore throat.   Eyes: Negative for pain and visual disturbance.  Respiratory: Negative for cough and shortness of breath.   Cardiovascular: Negative for chest pain and palpitations.  Gastrointestinal: Negative for abdominal pain, diarrhea, nausea and vomiting.  Genitourinary: Negative for dysuria and hematuria.  Musculoskeletal: Negative for arthralgias and back pain.  Skin: Negative for color change and rash.  Neurological: Positive for dizziness and headaches. Negative for tremors, seizures, syncope, facial asymmetry, speech difficulty, weakness, light-headedness and numbness.  All other systems reviewed and are negative.    Physical Exam Triage Vital Signs ED Triage Vitals  Enc Vitals Group     BP 08/25/19 1116 124/90       Pulse Rate 08/25/19 1116 73     Resp 08/25/19 1116 18     Temp 08/25/19 1116 99.6 F (37.6 C)     Temp Source 08/25/19 1116 Oral     SpO2 08/25/19 1116 100 %     Weight 08/25/19 1117 131 lb (59.4 kg)     Height 08/25/19 1117 5\' 9"  (1.753 m)     Head Circumference --      Peak Flow --      Pain Score 08/25/19 1117 6     Pain Loc --      Pain Edu? --      Excl. in GC? --    No data found.  Updated Vital Signs BP 124/90 (BP Location: Left Arm)   Pulse 73   Temp 99.6 F (37.6 C) (Oral)   Resp 18   Ht 5\' 9"  (1.753 m)   Wt 131 lb (59.4 kg)   SpO2 100%   Breastfeeding No   BMI 19.35 kg/m   Visual Acuity Right Eye Distance:   Left Eye Distance:   Bilateral Distance:    Right Eye Near:   Left Eye Near:    Bilateral Near:     Physical Exam Vitals and nursing note reviewed.  Constitutional:      General: She is not in acute distress.    Appearance: She is well-developed. She is not ill-appearing.  HENT:     Head: Normocephalic and atraumatic.     Right Ear: Tympanic membrane normal.     Left Ear: Tympanic membrane normal.     Nose: Nose normal.     Mouth/Throat:     Mouth: Mucous membranes are moist.     Pharynx: Oropharynx is clear.  Eyes:     Conjunctiva/sclera: Conjunctivae normal.  Cardiovascular:     Rate and Rhythm: Normal rate and regular rhythm.     Heart sounds: No murmur.  Pulmonary:     Effort: Pulmonary effort is normal. No respiratory distress.     Breath sounds: Normal breath sounds.  Abdominal:     General: Bowel sounds are normal.     Palpations: Abdomen is soft.     Tenderness: There is no abdominal tenderness. There is no guarding or rebound.  Musculoskeletal:        General: Normal range of motion.     Cervical back: Neck supple.     Right lower leg: No edema.     Left lower leg: No edema.  Skin:    General: Skin is warm and dry.     Findings: No rash.  Neurological:     General: No focal deficit present.     Mental Status: She is  alert and oriented to person, place, and  time.     Cranial Nerves: No cranial nerve deficit.     Sensory: No sensory deficit.     Motor: No weakness.     Coordination: Coordination normal.     Gait: Gait normal.     Deep Tendon Reflexes: Reflexes normal.  Psychiatric:        Mood and Affect: Mood normal.        Behavior: Behavior normal.      UC Treatments / Results  Labs (all labs ordered are listed, but only abnormal results are displayed) Labs Reviewed - No data to display  EKG   Radiology No results found.  Procedures Procedures (including critical care time)  Medications Ordered in UC Medications - No data to display  Initial Impression / Assessment and Plan / UC Course  I have reviewed the triage vital signs and the nursing notes.  Pertinent labs & imaging results that were available during my care of the patient were reviewed by me and considered in my medical decision making (see chart for details).    Dizziness, hypertension.  Patient is well-appearing and her exam is unremarkable.  Instructed her to follow-up as scheduled with her new PCP in 1 to 2 weeks.  Instructed her to go to the emergency department if she has acute worsening symptoms.  Patient agrees to plan of care.     Final Clinical Impressions(s) / UC Diagnoses   Final diagnoses:  Dizziness  Essential hypertension     Discharge Instructions     Follow-up with your primary care provider for recheck of your blood pressure in 1 to 2 weeks.    Go to the emergency department if you have acute worsening symptoms.        ED Prescriptions    None     PDMP not reviewed this encounter.   Sharion Balloon, NP 08/25/19 1152

## 2019-08-25 NOTE — Telephone Encounter (Signed)
FYI Caitlin [name], Call pt  She has been referred to [place] from Dr. Logan Bores, OB/GYN.  Please confirm that she is taking the Procardia and what dose.  Is she taking procardia 30 mg once per day?  She will need triage . Triage for shortness of breath, blurry vision, headache.  Has she been on other blood pressure medications in the past? Has she ever been to cardiology?  Can we move her appt up?   BP Readings from Last 3 Encounters:  08/24/19 (!) 142/105  06/04/19 113/85  05/06/19 131/86

## 2019-08-25 NOTE — Telephone Encounter (Signed)
In urgent care

## 2019-08-25 NOTE — Telephone Encounter (Signed)
Patient stated that she feels dizzy when she goes from laying down to standing & she will have to sit back down for a few minutes then try standing again. She said that she does however keep a headache. She said this has been since having her baby in August. She stated that she feels like she may blackout at times. She denies any SOB, Chest Pain or blurry vision. She said that she has stomach pains for time to time, but not in her chest.   Patient was unable to come in on Friday due to her being out of town 11th-14th. Ihave moved up appointment to 3/23.

## 2019-08-25 NOTE — ED Triage Notes (Signed)
Patient states that she was seen yesterday and was told her BP was high by GYN.  Patient states that she called her PCP and they couldn't get her in and advised that she should come here. Patient states that this has been an ongoing issue since August when she had a baby. Patient states that she has been noticing dizziness and this has been since august as well.

## 2019-08-25 NOTE — Discharge Instructions (Signed)
Follow-up with your primary care provider for recheck of your blood pressure in 1 to 2 weeks.    Go to the emergency department if you have acute worsening symptoms.

## 2019-08-25 NOTE — Telephone Encounter (Signed)
I spoke with patient & she is headed to the [facility] across the street from [place] now. She did say that she woke up with a headache & she will take tylenol for the HA to be relieved for a while then she ends up having to take more.   Patient is taking the 30MG  of Procardia daily as well.

## 2019-08-26 NOTE — Telephone Encounter (Signed)
Called to check to see how patient was doing today. Patient stated that she is feeling much better today. I told her that is she needs anything to let me know and we can get her in for BP check.

## 2019-08-28 ENCOUNTER — Ambulatory Visit: Payer: BC Managed Care – PPO | Admitting: Family

## 2019-09-08 ENCOUNTER — Ambulatory Visit: Payer: BC Managed Care – PPO | Admitting: Family

## 2019-09-08 DIAGNOSIS — Z0289 Encounter for other administrative examinations: Secondary | ICD-10-CM

## 2019-10-05 ENCOUNTER — Ambulatory Visit: Payer: BC Managed Care – PPO | Admitting: Family

## 2019-11-11 ENCOUNTER — Ambulatory Visit: Payer: BC Managed Care – PPO

## 2019-11-19 ENCOUNTER — Other Ambulatory Visit: Payer: Self-pay

## 2019-11-19 ENCOUNTER — Ambulatory Visit (INDEPENDENT_AMBULATORY_CARE_PROVIDER_SITE_OTHER): Payer: Medicaid Other | Admitting: Surgical

## 2019-11-19 DIAGNOSIS — Z3042 Encounter for surveillance of injectable contraceptive: Secondary | ICD-10-CM | POA: Diagnosis not present

## 2019-11-19 MED ORDER — MEDROXYPROGESTERONE ACETATE 150 MG/ML IM SUSP
150.0000 mg | Freq: Once | INTRAMUSCULAR | Status: AC
  Administered 2019-11-19: 150 mg via INTRAMUSCULAR

## 2019-11-19 NOTE — Progress Notes (Signed)
Date last pap:NA °Last Depo-Provera: 08/24/2019 °Side Effects if any:NA °Serum HCG indicated? NA °Depo-Provera 150 mg IM given by: J. Takenya Travaglini CMA °Next appointment due 02/04/2020-02/18/2020 ° °

## 2020-02-04 ENCOUNTER — Ambulatory Visit: Payer: Medicaid Other

## 2022-04-04 ENCOUNTER — Encounter: Payer: Self-pay | Admitting: Obstetrics and Gynecology
